# Patient Record
Sex: Male | Born: 1940 | Race: Black or African American | Hispanic: No | Marital: Married | Smoking: Former smoker
Health system: Southern US, Community
[De-identification: ages and names within clinical notes are randomized; demographics above are authoritative.]

## PROBLEM LIST (undated history)

## (undated) DIAGNOSIS — I10 Essential (primary) hypertension: Secondary | ICD-10-CM

## (undated) DIAGNOSIS — R001 Bradycardia, unspecified: Secondary | ICD-10-CM

## (undated) DIAGNOSIS — I639 Cerebral infarction, unspecified: Secondary | ICD-10-CM

## (undated) HISTORY — PX: NO PAST SURGERIES: SHX2092

---

## 2005-08-05 HISTORY — PX: CAROTID STENT: SHX1301

## 2013-05-31 ENCOUNTER — Emergency Department: Payer: Self-pay | Admitting: Emergency Medicine
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
## 2021-05-18 ENCOUNTER — Observation Stay (HOSPITAL_COMMUNITY)
Admission: EM | Admit: 2021-05-18 | Discharge: 2021-05-19 | Disposition: A | Discharge status: Home / Self Care | Payer: No Typology Code available for payment source | Attending: Emergency Medicine | Admitting: Emergency Medicine

## 2021-05-18 ENCOUNTER — Emergency Department (HOSPITAL_COMMUNITY): Payer: No Typology Code available for payment source

## 2021-05-18 ENCOUNTER — Encounter (HOSPITAL_COMMUNITY): Payer: Self-pay | Admitting: Emergency Medicine

## 2021-05-18 ENCOUNTER — Observation Stay (HOSPITAL_COMMUNITY): Payer: No Typology Code available for payment source

## 2021-05-18 DIAGNOSIS — Z7982 Long term (current) use of aspirin: Secondary | ICD-10-CM | POA: Insufficient documentation

## 2021-05-18 DIAGNOSIS — Y900 Blood alcohol level of less than 20 mg/100 ml: Secondary | ICD-10-CM | POA: Diagnosis not present

## 2021-05-18 DIAGNOSIS — I129 Hypertensive chronic kidney disease with stage 1 through stage 4 chronic kidney disease, or unspecified chronic kidney disease: Secondary | ICD-10-CM | POA: Diagnosis not present

## 2021-05-18 DIAGNOSIS — Z20822 Contact with and (suspected) exposure to covid-19: Secondary | ICD-10-CM | POA: Insufficient documentation

## 2021-05-18 DIAGNOSIS — N189 Chronic kidney disease, unspecified: Secondary | ICD-10-CM | POA: Diagnosis not present

## 2021-05-18 DIAGNOSIS — R531 Weakness: Secondary | ICD-10-CM

## 2021-05-18 DIAGNOSIS — R7303 Prediabetes: Secondary | ICD-10-CM | POA: Diagnosis not present

## 2021-05-18 DIAGNOSIS — I639 Cerebral infarction, unspecified: Secondary | ICD-10-CM | POA: Diagnosis not present

## 2021-05-18 DIAGNOSIS — Z8673 Personal history of transient ischemic attack (TIA), and cerebral infarction without residual deficits: Secondary | ICD-10-CM | POA: Insufficient documentation

## 2021-05-18 DIAGNOSIS — R42 Dizziness and giddiness: Secondary | ICD-10-CM | POA: Diagnosis present

## 2021-05-18 DIAGNOSIS — Z79899 Other long term (current) drug therapy: Secondary | ICD-10-CM | POA: Insufficient documentation

## 2021-05-18 DIAGNOSIS — R001 Bradycardia, unspecified: Secondary | ICD-10-CM

## 2021-05-18 DIAGNOSIS — I2510 Atherosclerotic heart disease of native coronary artery without angina pectoris: Secondary | ICD-10-CM | POA: Insufficient documentation

## 2021-05-18 DIAGNOSIS — R2681 Unsteadiness on feet: Secondary | ICD-10-CM

## 2021-05-18 DIAGNOSIS — I6330 Cerebral infarction due to thrombosis of unspecified cerebral artery: Secondary | ICD-10-CM

## 2021-05-18 DIAGNOSIS — R2689 Other abnormalities of gait and mobility: Secondary | ICD-10-CM

## 2021-05-18 HISTORY — DX: Cerebral infarction, unspecified: I63.9

## 2021-05-18 HISTORY — DX: Bradycardia, unspecified: R00.1

## 2021-05-18 HISTORY — DX: Essential (primary) hypertension: I10

## 2021-05-18 LAB — COMPREHENSIVE METABOLIC PANEL
ALT: 15 U/L (ref 0–44)
AST: 29 U/L (ref 15–41)
Albumin: 3.7 g/dL (ref 3.5–5.0)
Alkaline Phosphatase: 63 U/L (ref 38–126)
Anion gap: 9 (ref 5–15)
BUN: 11 mg/dL (ref 8–23)
CO2: 24 mmol/L (ref 22–32)
Calcium: 9.6 mg/dL (ref 8.9–10.3)
Chloride: 105 mmol/L (ref 98–111)
Creatinine, Ser: 1.34 mg/dL — ABNORMAL HIGH (ref 0.61–1.24)
GFR, Estimated: 55 mL/min — ABNORMAL LOW (ref >60)
Glucose, Bld: 151 mg/dL — ABNORMAL HIGH (ref 70–99)
Potassium: 3.6 mmol/L (ref 3.5–5.1)
Sodium: 138 mmol/L (ref 135–145)
Total Bilirubin: 0.7 mg/dL (ref 0.3–1.2)
Total Protein: 6.9 g/dL (ref 6.5–8.1)

## 2021-05-18 LAB — URINALYSIS, ROUTINE W REFLEX MICROSCOPIC
Bilirubin Urine: NEGATIVE
Glucose, UA: NEGATIVE mg/dL
Hgb urine dipstick: NEGATIVE
Ketones, ur: NEGATIVE mg/dL
Leukocytes,Ua: NEGATIVE
Nitrite: NEGATIVE
Protein, ur: NEGATIVE mg/dL
Specific Gravity, Urine: 1.019 (ref 1.005–1.030)
pH: 6 (ref 5.0–8.0)

## 2021-05-18 LAB — RAPID URINE DRUG SCREEN, HOSP PERFORMED
Amphetamines: NOT DETECTED
Barbiturates: NOT DETECTED
Benzodiazepines: NOT DETECTED
Cocaine: NOT DETECTED
Opiates: NOT DETECTED
Tetrahydrocannabinol: NOT DETECTED

## 2021-05-18 LAB — I-STAT CHEM 8, ED
BUN: 12 mg/dL (ref 8–23)
Calcium, Ion: 1.2 mmol/L (ref 1.15–1.40)
Chloride: 103 mmol/L (ref 98–111)
Creatinine, Ser: 1.2 mg/dL (ref 0.61–1.24)
Glucose, Bld: 153 mg/dL — ABNORMAL HIGH (ref 70–99)
HCT: 39 % (ref 39.0–52.0)
Hemoglobin: 13.3 g/dL (ref 13.0–17.0)
Potassium: 3.7 mmol/L (ref 3.5–5.1)
Sodium: 140 mmol/L (ref 135–145)
TCO2: 26 mmol/L (ref 22–32)

## 2021-05-18 LAB — TROPONIN I (HIGH SENSITIVITY): Troponin I (High Sensitivity): 8 ng/L (ref <18)

## 2021-05-18 LAB — CBC
HCT: 39.7 % (ref 39.0–52.0)
Hemoglobin: 12.8 g/dL — ABNORMAL LOW (ref 13.0–17.0)
MCH: 30.1 pg (ref 26.0–34.0)
MCHC: 32.2 g/dL (ref 30.0–36.0)
MCV: 93.4 fL (ref 80.0–100.0)
Platelets: 210 10*3/uL (ref 150–400)
RBC: 4.25 MIL/uL (ref 4.22–5.81)
RDW: 13.6 % (ref 11.5–15.5)
WBC: 5.5 10*3/uL (ref 4.0–10.5)
nRBC: 0 % (ref 0.0–0.2)

## 2021-05-18 LAB — LIPID PANEL
Cholesterol: 169 mg/dL (ref 0–200)
HDL: 45 mg/dL (ref >40)
LDL Cholesterol: 109 mg/dL — ABNORMAL HIGH (ref 0–99)
Total CHOL/HDL Ratio: 3.8 RATIO
Triglycerides: 76 mg/dL (ref <150)
VLDL: 15 mg/dL (ref 0–40)

## 2021-05-18 LAB — ETHANOL: Alcohol, Ethyl (B): <10 mg/dL (ref <10)

## 2021-05-18 LAB — HEMOGLOBIN A1C
Hgb A1c MFr Bld: 5.7 % — ABNORMAL HIGH (ref 4.8–5.6)
Mean Plasma Glucose: 116.89 mg/dL

## 2021-05-18 LAB — DIFFERENTIAL
Abs Immature Granulocytes: 0.02 10*3/uL (ref 0.00–0.07)
Basophils Absolute: 0 10*3/uL (ref 0.0–0.1)
Basophils Relative: 0 %
Eosinophils Absolute: 0.2 10*3/uL (ref 0.0–0.5)
Eosinophils Relative: 4 %
Immature Granulocytes: 0 %
Lymphocytes Relative: 29 %
Lymphs Abs: 1.6 10*3/uL (ref 0.7–4.0)
Monocytes Absolute: 0.3 10*3/uL (ref 0.1–1.0)
Monocytes Relative: 6 %
Neutro Abs: 3.3 10*3/uL (ref 1.7–7.7)
Neutrophils Relative %: 61 %

## 2021-05-18 LAB — PROTIME-INR
INR: 1 (ref 0.8–1.2)
Prothrombin Time: 13.5 seconds (ref 11.4–15.2)

## 2021-05-18 LAB — RESP PANEL BY RT-PCR (FLU A&B, COVID) ARPGX2
Influenza A by PCR: NEGATIVE
Influenza B by PCR: NEGATIVE
SARS Coronavirus 2 by RT PCR: NEGATIVE

## 2021-05-18 LAB — APTT: aPTT: 35 seconds (ref 24–36)

## 2021-05-18 MED ORDER — ACETAMINOPHEN 650 MG RE SUPP: 650.0000 mg | Freq: Four times a day (QID) | RECTAL | Status: DC | PRN

## 2021-05-18 MED ORDER — CLOPIDOGREL BISULFATE 75 MG PO TABS
75.0000 mg | ORAL_TABLET | Freq: Every day | ORAL | Status: DC
  Administered 2021-05-19: 75 mg via ORAL
  Filled 2021-05-18: qty 1

## 2021-05-18 MED ORDER — ASPIRIN 325 MG PO TABS
325.0000 mg | ORAL_TABLET | Freq: Once | ORAL | Status: DC
  Filled 2021-05-18: qty 1

## 2021-05-18 MED ORDER — POLYETHYLENE GLYCOL 3350 17 G PO PACK: 17.0000 g | PACK | Freq: Every day | ORAL | Status: DC | PRN

## 2021-05-18 MED ORDER — CLOPIDOGREL BISULFATE 75 MG PO TABS
75.0000 mg | ORAL_TABLET | Freq: Once | ORAL | Status: AC
  Administered 2021-05-18: 75 mg via ORAL
  Filled 2021-05-18: qty 1

## 2021-05-18 MED ORDER — ATORVASTATIN CALCIUM 80 MG PO TABS
80.0000 mg | ORAL_TABLET | Freq: Every day | ORAL | Status: DC
  Administered 2021-05-19: 80 mg via ORAL
  Filled 2021-05-18: qty 1

## 2021-05-18 MED ORDER — ACETAMINOPHEN 325 MG PO TABS: 650.0000 mg | ORAL_TABLET | Freq: Four times a day (QID) | ORAL | Status: DC | PRN

## 2021-05-18 MED ORDER — ASPIRIN EC 81 MG PO TBEC
81.0000 mg | DELAYED_RELEASE_TABLET | Freq: Every day | ORAL | Status: DC
  Administered 2021-05-19: 81 mg via ORAL
  Filled 2021-05-18: qty 1

## 2021-05-18 MED ORDER — ENOXAPARIN SODIUM 40 MG/0.4ML IJ SOSY
40.0000 mg | PREFILLED_SYRINGE | INTRAMUSCULAR | Status: DC
  Administered 2021-05-18 – 2021-05-19 (×2): 40 mg via SUBCUTANEOUS
  Filled 2021-05-18 (×2): qty 0.4

## 2021-05-18 NOTE — H&P (Signed)
Date: 05/18/2021               Patient Name:  Ryan Valdez MRN: [id number]  DOB: 07/27/1941 Age / Sex: 80 y.o., male   PCP: [facility]         Medical Service: Internal Medicine Teaching Service         Attending Physician: Dr. Reymundo Poll, MD    First Contact: Dr. Welton Flakes Pager: [phone]  Second Contact: Dr. Huel Cote Pager: [phone]       After Hours (After 5p/  First Contact Pager: [phone]  weekends / holidays): Second Contact Pager: [phone]   Chief Complaint: Unsteadiness  History of Present Illness:   [name] is a 80 y/o male with a PMHx of CVA, CAD s/p PCI, HLD, HTN, OSA who presents to the ED with c/o unsteadiness.   [name] states that he was driving to [place], [place] today to visit his daughter and help her move into a new home.  On the drive here, he noticed that he was having difficulty with depth perception in terms of knowing when to break.  Once he arrived at his daughter's home, he noticed a feeling of unsteadiness.  He recalls chronic lower left weakness from prior stroke but no other focal neurological deficit.  He denies any trauma, loss of consciousness, dizziness, headache, vision changes, chest pain, shortness of breath.  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  He cannot recall the stroke is similar to prior presentation.  Otherwise, [name] endorses some congestion and mild cough for the last 3 or 4 days.  He has not been around anyone else was sick.  [name] endorses a past medical history of bradycardia with usual heart rate between 50s and 60s.  He has only occasionally seen his heart rate in the 40s.  He has always been asymptomatic.  Meds:  Aspirin 81 mg daily Amlodipine 10 mg daily Atorvastatin 80 mg daily Lisinopril 40 mg daily Tamsulosin 0.4 mg daily   Allergies: Allergies as of 05/18/2021 - Review Complete 05/18/2021  Allergen Reaction Noted   Iodine Hives  04/04/2008   Iodinated diagnostic agents Hives 01/20/2019   Shellfish-derived products Hives 02/21/2020   Past Medical History:  Diagnosis Date   Stroke [facility])    Family History:  CVA-father Hypertension-sister  Social History:  Lives in [place], [place] alone. Independent in ADLs. Support system includes sister who lives near by.  Former tobacco user. Quit approximately 24 years ago.  Denies alcohol or drug use  [place] veteran   Review of Systems: A complete ROS was negative except as per HPI.   Physical Exam: Blood pressure (!) 175/94, pulse (!) 48, temperature 97.7 F (36.5 C), temperature source Oral, resp. rate 19, height 5\' 8"  (1.727 m), weight 86.2 kg, SpO2 98 %.  Physical Exam Vitals and nursing note reviewed.  Constitutional:      General: He is not in acute distress.    Appearance: He is normal weight.  HENT:     Head: Normocephalic and atraumatic.     Mouth/Throat:     Mouth: Mucous membranes are moist.     Pharynx: Oropharynx is clear.  Eyes:     General: No visual field deficit.    Extraocular Movements: Extraocular movements intact.     Conjunctiva/sclera: Conjunctivae normal.     Pupils: Pupils are equal, round, and reactive to light.  Cardiovascular:  Rate and Rhythm: Regular rhythm. Bradycardia present.     Heart sounds: No murmur heard.    Comments: Occasional PVCs noted on telemetry Pulmonary:     Effort: Pulmonary effort is normal. No respiratory distress.     Breath sounds: Normal breath sounds. No wheezing or rales.  Abdominal:     General: Bowel sounds are normal. There is no distension.     Palpations: Abdomen is soft.     Tenderness: There is no abdominal tenderness. There is no guarding.  Musculoskeletal:     Right lower leg: No edema.     Left lower leg: No edema.  Skin:    General: Skin is warm and dry.  Neurological:     Mental Status: He is alert and oriented to person, place, and time.     Cranial Nerves: Cranial nerves are  intact. No dysarthria or facial asymmetry.     Sensory: Sensation is intact. No sensory deficit.     Motor: Motor function is intact.     Comments: 5/5 strength in all upper and lower extremities. No pronator drift  Psychiatric:        Mood and Affect: Mood normal.        Behavior: Behavior normal.        Thought Content: Thought content normal.        Judgment: Judgment normal.   EKG: personally reviewed my interpretation is: Sinus bradycardia with first-degree AV block; J-point elevation in V2 and 3.  No other ST or T wave abnormalities.  Assessment & Plan by Problem: Active Problems:   Gait instability  Ryan Valdez is a 80 year old gentleman with a past medical history of CVA, CAD s/p PCI, hypertension, hyperlipidemia, OSA who is presenting for unsteadiness and currently admitted for acute CVA evaluation.  # Gait Instability  # Hx of  Patient presenting with several hour history of unsteadiness on his feet and subjective depth perception difficulties.  Given history of prior CVA with multiple risk factor (including prior stroke, hypertension, hyperlipidemia, and prior carotid artery stenosis), work-up for acute CVA initiated.  CT head is negative with MRI pending.  Differential also includes symptomatic bradycardia, however this would be an unusual presentation.  On the other hand, patient has a history of antalgic gait dating back to 2019 for which he was evaluated by [facility] neurology; work-up seems to raise suspicion that it may be secondary to spinal stenosis.  - Neurology following; appreciate their recommendations - MRI brain pending - If evidence of acute infarct, will need MRA head and neck - Aspirin 324 mg once.  Start aspirin 81 mg daily tomorrow - Plavix 75 mg - Resume home atorvastatin 80 mg daily - A1c, lipid panel pending  # Sinus Bradycardia  Patient presenting with sinus bradycardia with first-degree AV block with rates predominantly in the 40s to 50s.  Patient denies  any dizziness or loss of consciousness.  Per chart review, bradycardia noted back in 2020.  - TTE ordered to evaluate for structural abnormality - TSH pending - Telemetry  # Hypertension  - Hold home antihypertensives for permissive hypertension  # Hx of PFO During evaluation for prior CVA, patient was noted to have a PFO with right to left interatrial shunt.  He followed up with the [facility].  Patient did not pursue PFO closure.  I have low suspicion that this is contributing to current gait instability.  # CKD  Per review of limited previous labs, creatinine seems  at baseline with prior creatinine of 1.4 in June 2022.  Diet: Heart Healthy VTE: Enoxaparin IVF: None,None Code: Full  Prior to Admission Living Arrangement: Home, living alone Anticipated Discharge Location: Home Barriers to Discharge: Medical evaluation, including MRI and TTE  Dispo: Admit patient to Observation with expected length of stay less than 2 midnights.  Signed: Dr. Verdene Lennert Internal Medicine PGY-3  Pager: [phone] After 5pm on weekdays and 1pm on weekends: On Call pager [phone]  05/18/2021, 7:58 PM
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
## 2021-05-18 NOTE — ED Notes (Signed)
Sent a follow up message to PA to inform about pt low HR and aspirin dose order.

## 2021-05-18 NOTE — ED Notes (Signed)
Pt stated he took one 81 mg aspirin today. Notified EDP to inquire about the aspirin dose the pt can take.

## 2021-05-18 NOTE — Progress Notes (Signed)
Stroke Response Nurse Documentation Code Documentation  Ryan Valdez is a 80 y.o. male arriving to [facility] ED via Private Vehicle on 05-18-2021 with past medical hx of CVA, carotid stent. On No antithrombotic. Code stroke was activated by ED.   Patient from home where he was LKW at 1120 and now complaining of being off balance.  He left [place] about 1120 and felt his normal self.  While he was driving he felt that his reaction time was slow especially with his left leg.  After he arrived in [place] he and family states that he was "off balance".  He states he has had a cold this week and took some cold medicine today, otherwise he has had no complaints.   Stroke team at the bedside on patient arrival. Labs drawn and patient cleared for CT by Fayrene Helper PA. Patient to CT with team. NIHSS 0, see documentation for details and code stroke times. Patient with no focal deficits on exam.  He is a little off balance when he stands up. The following imaging was completed:  CT. Patient is not a candidate for IV Thrombolytic due to mild symptom. Patient is not a candidate for IR due to no LVO suspected.   Care/Plan: VS and neuro checks q 2 hours.   Bedside handoff with ED RN Carollee Herter.    Marcellina Millin  Stroke Response RN

## 2021-05-18 NOTE — ED Notes (Signed)
Called [facility] to activate code stroke, spoke with Michele Mcalpine

## 2021-05-18 NOTE — ED Notes (Signed)
PA stated to give the original 324 aspirin dose and the admitting doctor will be notified about pt bradycardia.

## 2021-05-18 NOTE — ED Notes (Signed)
RN Aware of pt HR  

## 2021-05-18 NOTE — ED Notes (Signed)
Noticed pt has low HR, unsure if it is his normal. Notified EDP

## 2021-05-18 NOTE — ED Triage Notes (Signed)
Pt arrives with family who reports he has been off balance for about an hour. Pt states that when he was driving today, he felt like his reaction time was delayed. ED PA at bedside to evaluate.

## 2021-05-18 NOTE — ED Notes (Signed)
PA states to activate code stroke
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
## 2021-05-19 ENCOUNTER — Encounter (HOSPITAL_COMMUNITY): Payer: Self-pay | Admitting: Internal Medicine

## 2021-05-19 ENCOUNTER — Other Ambulatory Visit (HOSPITAL_COMMUNITY): Payer: Non-veteran care

## 2021-05-19 ENCOUNTER — Observation Stay (HOSPITAL_BASED_OUTPATIENT_CLINIC_OR_DEPARTMENT_OTHER): Payer: No Typology Code available for payment source

## 2021-05-19 ENCOUNTER — Observation Stay (HOSPITAL_COMMUNITY): Payer: No Typology Code available for payment source

## 2021-05-19 DIAGNOSIS — I6389 Other cerebral infarction: Secondary | ICD-10-CM | POA: Diagnosis not present

## 2021-05-19 DIAGNOSIS — R001 Bradycardia, unspecified: Secondary | ICD-10-CM | POA: Diagnosis not present

## 2021-05-19 DIAGNOSIS — I639 Cerebral infarction, unspecified: Secondary | ICD-10-CM

## 2021-05-19 DIAGNOSIS — I6330 Cerebral infarction due to thrombosis of unspecified cerebral artery: Secondary | ICD-10-CM | POA: Diagnosis not present

## 2021-05-19 DIAGNOSIS — I6381 Other cerebral infarction due to occlusion or stenosis of small artery: Secondary | ICD-10-CM | POA: Diagnosis not present

## 2021-05-19 LAB — ECHOCARDIOGRAM COMPLETE
AR max vel: 2.1 cm2
AV Area VTI: 2.17 cm2
AV Area mean vel: 2.2 cm2
AV Mean grad: 6 mmHg
AV Peak grad: 13.1 mmHg
Ao pk vel: 1.81 m/s
Area-P 1/2: 2.9 cm2
Calc EF: 64.1 %
Height: 68 in
MV VTI: 2.42 cm2
S' Lateral: 2.5 cm
Single Plane A2C EF: 59.9 %
Single Plane A4C EF: 66.7 %
Weight: 3040 oz

## 2021-05-19 LAB — BASIC METABOLIC PANEL
Anion gap: 7 (ref 5–15)
BUN: 9 mg/dL (ref 8–23)
CO2: 27 mmol/L (ref 22–32)
Calcium: 9.4 mg/dL (ref 8.9–10.3)
Chloride: 103 mmol/L (ref 98–111)
Creatinine, Ser: 1.18 mg/dL (ref 0.61–1.24)
GFR, Estimated: >60 mL/min (ref >60)
Glucose, Bld: 82 mg/dL (ref 70–99)
Potassium: 3.5 mmol/L (ref 3.5–5.1)
Sodium: 137 mmol/L (ref 135–145)

## 2021-05-19 LAB — CBC
HCT: 38.1 % — ABNORMAL LOW (ref 39.0–52.0)
Hemoglobin: 12.4 g/dL — ABNORMAL LOW (ref 13.0–17.0)
MCH: 30 pg (ref 26.0–34.0)
MCHC: 32.5 g/dL (ref 30.0–36.0)
MCV: 92.3 fL (ref 80.0–100.0)
Platelets: 207 10*3/uL (ref 150–400)
RBC: 4.13 MIL/uL — ABNORMAL LOW (ref 4.22–5.81)
RDW: 13.5 % (ref 11.5–15.5)
WBC: 4.5 10*3/uL (ref 4.0–10.5)
nRBC: 0 % (ref 0.0–0.2)

## 2021-05-19 MED ORDER — STROKE: EARLY STAGES OF RECOVERY BOOK: Freq: Once | Status: DC

## 2021-05-19 MED ORDER — EZETIMIBE 10 MG PO TABS: 10.0000 mg | ORAL_TABLET | Freq: Every day | ORAL | Status: DC

## 2021-05-19 MED ORDER — ATORVASTATIN CALCIUM 40 MG PO TABS: 40.0000 mg | ORAL_TABLET | Freq: Every day | ORAL | Status: DC

## 2021-05-19 MED ORDER — LISINOPRIL 40 MG PO TABS: 20.0000 mg | ORAL_TABLET | Freq: Every day | ORAL | 0 refills | Status: AC

## 2021-05-19 MED ORDER — ASPIRIN 81 MG PO TBEC: 81.0000 mg | DELAYED_RELEASE_TABLET | Freq: Every day | ORAL | 0 refills | Status: AC

## 2021-05-19 MED ORDER — AMLODIPINE BESYLATE 10 MG PO TABS: 10.0000 mg | ORAL_TABLET | Freq: Every day | ORAL | 0 refills | Status: AC

## 2021-05-19 MED ORDER — CLOPIDOGREL BISULFATE 75 MG PO TABS: 75.0000 mg | ORAL_TABLET | Freq: Every day | ORAL | 0 refills | Status: AC

## 2021-05-19 NOTE — ED Notes (Signed)
Breakfast Order Placed ?

## 2021-05-19 NOTE — Evaluation (Signed)
Physical Therapy Evaluation and Discharge Patient Details Name: Ryan Valdez MRN: [id number] DOB: 1940-12-07 Today's Date: 05/19/2021  History of Present Illness  79 y/o male presented to the ED 05/18/21 with c/o unsteadiness. CT head negative. symptoms resolved with NIHSS=0; +sinus bradycardia with first degree AV block; MRI incidental punctate left caudate tail stroke   PMHx of cerebellar CVA, CAD s/p PCI, HLD, HTN, OSA  Clinical Impression   Patient evaluated by Physical Therapy with no further acute PT needs identified. All education has been completed and the patient has no further questions.  PT is signing off. Thank you for this referral.      Recommendations for follow up therapy are one component of a multi-disciplinary discharge planning process, led by the attending physician.  Recommendations may be updated based on patient status, additional functional criteria and insurance authorization.  Follow Up Recommendations No PT follow up    Equipment Recommendations  None recommended by PT    Recommendations for Other Services       Precautions / Restrictions Precautions Precautions: Fall Precaution Comments: low risk      Mobility  Bed Mobility Overal bed mobility: Independent                  Transfers Overall transfer level: Modified independent Equipment used: None                Ambulation/Gait Ambulation/Gait assistance: Modified independent (Device/Increase time) Gait Distance (Feet): 130 Feet Assistive device:  (pt's walking stick) Gait Pattern/deviations: Step-through pattern;Wide base of support;Antalgic   Gait velocity interpretation: 1.31 - 2.62 ft/sec, indicative of limited community ambulator General Gait Details: slight antalgic pattern  Stairs            Wheelchair Mobility    Modified Rankin (Stroke Patients Only) Modified Rankin (Stroke Patients Only) Pre-Morbid Rankin Score: Moderate disability Modified Rankin: Moderate  disability     Balance     Sitting balance-Leahy Scale: Normal Sitting balance - Comments: donned shoes sitting at EOB   Standing balance support: No upper extremity supported Standing balance-Leahy Scale: Good           Rhomberg - Eyes Opened: 30 Rhomberg - Eyes Closed: 10 (mild incr sway with independent recovery)                 Pertinent Vitals/Pain Pain Assessment: Faces Faces Pain Scale: Hurts a little bit Pain Location: left hip Pain Descriptors / Indicators: Discomfort Pain Intervention(s): Monitored during session    Home Living Family/patient expects to be discharged to:: Private residence Living Arrangements: Alone Available Help at Discharge: Family Type of Home: Mobile home Home Access: Stairs to enter Entrance Stairs-Rails: Right Entrance Stairs-Number of Steps: 5 Home Layout: One level Home Equipment: Other (comment) (walking stick)      Prior Function Level of Independence: Independent with assistive device(s)         Comments: uses walking stick since August due to sciatica/left hip pain     Hand Dominance   Dominant Hand: Right    Extremity/Trunk Assessment   Upper Extremity Assessment Upper Extremity Assessment: Defer to OT evaluation    Lower Extremity Assessment Lower Extremity Assessment: LLE deficits/detail LLE Coordination: decreased gross motor (minimally decreased heel to shin)    Cervical / Trunk Assessment Cervical / Trunk Assessment: Normal  Communication   Communication: No difficulties  Cognition Arousal/Alertness: Awake/alert Behavior During Therapy: WFL for tasks assessed/performed Overall Cognitive Status: Within Functional Limits for tasks assessed  General Comments      Exercises     Assessment/Plan    PT Assessment Patent does not need any further PT services  PT Problem List         PT Treatment Interventions      PT Goals (Current  goals can be found in the Care Plan section)  Acute Rehab PT Goals PT Goal Formulation: All assessment and education complete, DC therapy    Frequency     Barriers to discharge        Co-evaluation               AM-PAC PT "6 Clicks" Mobility  Outcome Measure Help needed turning from your back to your side while in a flat bed without using bedrails?: None Help needed moving from lying on your back to sitting on the side of a flat bed without using bedrails?: None Help needed moving to and from a bed to a chair (including a wheelchair)?: None Help needed standing up from a chair using your arms (e.g., wheelchair or bedside chair)?: None Help needed to walk in hospital room?: None Help needed climbing 3-5 steps with a railing? : None 6 Click Score: 24    End of Session Equipment Utilized During Treatment: Gait belt Activity Tolerance: Patient tolerated treatment well Patient left: in bed;with call bell/phone within reach;Other (comment) (with echo technician) Nurse Communication: Mobility status;Other (comment) (no PT or DME needs) PT Visit Diagnosis: Unsteadiness on feet (R26.81)    Time: 7939-0300 PT Time Calculation (min) (ACUTE ONLY): 11 min   Charges:   PT Evaluation $PT Eval Low Complexity: 1 Low           Jerolyn Center, PT Acute Rehabilitation Services  Pager [phone] Office [phone]   Zena Amos 05/19/2021, 3:16 PM

## 2021-05-19 NOTE — ED Notes (Signed)
Patient transported to MRI 

## 2021-05-19 NOTE — ED Notes (Signed)
Pt resting on stretcher with eyes closed, respirations even and unlabored. No acute changes noted. Will continue to monitor. Lights off, side rails up x2, call bell within reach.  

## 2021-05-19 NOTE — ED Notes (Signed)
PT in progress. °

## 2021-05-19 NOTE — Progress Notes (Addendum)
STROKE TEAM PROGRESS NOTE   ATTENDING NOTE: I reviewed above note and agree with the assessment and plan. Pt was seen and examined.   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  CT no acute abnormality, but chronic small left cerebellar infarct.  MRI showed punctate left caudate tail acute infarct.  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  Carotid Doppler unremarkable.  LE venous Doppler no DVT.  EF 60 to 65%.  LDL 109, A1c 5.7, UDS negative.  On exam, patient awake alert, sitting at edge of bed, no complaints.  Orientated x3, no aphasia, no dysarthria, follows simple commands, able to name repeat.  Visual fields full, no gaze palsy, facial symmetrical.  Bilateral upper extremity 5/5, left LE proximal 4/5 due to sciatica pain and chronic knee pain, distal 5/5.  Sensation symmetrical, finger-to-nose intact.  Etiology for patient stroke likely small vessel disease.  Recommend continue aspirin 81 and Plavix 75 DAPT for 3 weeks and then Plavix alone.  Continue statin.  Stroke risk factor modification.  PT no recommendation.  For detailed assessment and plan, please refer to above as I have made changes wherever appropriate.   Neurology will sign off. Please call with questions. Pt will follow up with stroke clinic NP at [facility] in about 4 weeks. Thanks for the consult.   Marvel Plan, MD PhD Stroke Neurology 05/19/2021 7:12 PM    INTERVAL HISTORY No acute events since arrival. No visitors at bedside.   Driving here from his home in [place] when he noticed depth perception was impaired and he was too close to other cars before he realized it. No tunnel vision, spots, floaters, dizziness or lightheadedness, chest pain, no palpitations, no feeling he would pass out, no nausea or  vomiting. He did not stop driving. Never had this kind of episode before. Vision changes resolved after he got to his daughter's house. Then after arriving there he felt for a few minutes like he was having the same stroke he had previously with onset of suddenly impaired balance. At that point he presented to ED. He thinks the feeling lasted for about an hour.   Today he feels completely back to normal, has no complaints. Would like to go home. He endorses good medication compliance. He gets all his care through the [place].  We discussed his stroke diagnosis, ongoing work and plan of care.His questions were answered.   Vitals:   05/19/21 0345 05/19/21 0445 05/19/21 0600 05/19/21 0630  BP: (!) 158/77 (!) 145/70 (!) 149/72 (!) 147/77  Pulse: (!) 47 (!) 50 (!) 46 (!) 45  Resp: 16 16 16 16   Temp:   98 F (36.7 C)   TempSrc:   Oral   SpO2: 99% 96% 99% 99%  Weight:      Height:       CBC:  Recent Labs  Lab 05/18/21 1515 05/19/21 0408  WBC 5.5 4.5  NEUTROABS 3.3  --   HGB 12.8* 12.4*  HCT 39.7 38.1*  MCV 93.4 92.3  PLT 210 207   Basic Metabolic Panel:  Recent Labs  Lab 05/18/21 1515 05/19/21 0408  NA 138 137  K 3.6 3.5  CL 105 103  CO2 24 27  GLUCOSE 151* 82  BUN 11 9  CREATININE 1.34* 1.18  CALCIUM 9.6 9.4   Lipid  Panel:  Recent Labs  Lab 05/18/21 1840  CHOL 169  TRIG 76  HDL 45  CHOLHDL 3.8  VLDL 15  LDLCALC 562*   HgbA1c:  Recent Labs  Lab 05/18/21 2000  HGBA1C 5.7*   Urine Drug Screen:  Recent Labs  Lab 05/18/21 1657  LABOPIA NONE DETECTED  COCAINSCRNUR NONE DETECTED  LABBENZ NONE DETECTED  AMPHETMU NONE DETECTED  THCU NONE DETECTED  LABBARB NONE DETECTED    Alcohol Level  Recent Labs  Lab 05/18/21 1515  ETH <10    IMAGING past 24 hours MR BRAIN WO CONTRAST  Result Date: 05/18/2021 CLINICAL DATA:  Neuro deficit, acute, stroke suspected EXAM: MRI HEAD WITHOUT CONTRAST TECHNIQUE: Multiplanar, multiecho pulse sequences of the brain and  surrounding structures were obtained without intravenous contrast. COMPARISON:  None. FINDINGS: Brain: There is a punctate focus of abnormal diffusion restriction at the tail of the left caudate. No acute or chronic hemorrhage. Hyperintense T2-weighted signal is moderately widespread throughout the white matter. Generalized volume loss without a clear lobar predilection. There are multiple old cerebellar infarcts. The midline structures are normal. Vascular: Major flow voids are preserved. Skull and upper cervical spine: Normal calvarium and skull base. Visualized upper cervical spine and soft tissues are normal. Sinuses/Orbits:No paranasal sinus fluid levels or advanced mucosal thickening. No mastoid or middle ear effusion. Normal orbits. IMPRESSION: 1. Punctate focus of acute ischemia at the left caudate tail. No hemorrhage or mass effect. 2. 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 Electronically Signed   By: Deatra Robinson M.D.   On: 05/18/2021 21:07   CT HEAD CODE STROKE WO CONTRAST  Result Date: 05/18/2021 CLINICAL DATA:  Code stroke.  Neuro deficit, acute, stroke suspected EXAM: CT HEAD WITHOUT CONTRAST TECHNIQUE: Contiguous axial images were obtained from the base of the skull through the vertex without intravenous contrast. COMPARISON:  2014 FINDINGS: Brain: There is no acute intracranial hemorrhage, mass effect, or edema. Patchy and confluent areas of hypoattenuation involving in the supratentorial white matter and central gray nuclei are nonspecific but probably reflect chronic microvascular ischemic changes. There are likely chronic infarcts of the cerebellum and right centrum semiovale. Prominence of the ventricles and sulci reflects mild parenchymal volume loss. No extra-axial collection. Vascular: No hyperdense vessel. There is intracranial atherosclerotic calcification at the skull base. Skull: Unremarkable. Sinuses/Orbits: Moderate paranasal sinus mucosal  thickening primarily involving ethmoids and maxillary sinuses. Orbits are unremarkable. Other: Mastoid air cells are clear. ASPECTS ([place] Stroke Program Early CT Score) - Ganglionic level infarction (caudate, lentiform nuclei, internal capsule, insula, M1-M3 cortex): 7 - Supraganglionic infarction (M4-M6 cortex): 3 Total score (0-10 with 10 being normal): 10 IMPRESSION: There is no acute intracranial hemorrhage or evidence of acute infarction. ASPECT score is 10. Chronic microvascular ischemic changes and likely chronic small vessel infarcts. Presence of a superimposed acute small vessel infarct is difficult to exclude. Initial results were communicated to Dr. Thomasena Edis at 3:13 pm on 05/18/2021 by text page via the [facility] messaging system. Electronically Signed   By: Guadlupe Spanish M.D.   On: 05/18/2021 15:16    PHYSICAL EXAM  Temp:  [97.7 F (36.5 C)-98 F (36.7 C)] 98 F (36.7 C) (10/15 0600) Pulse Rate:  [29-88] 44 (10/15 1031) Resp:  [11-19] 15 (10/15 1031) BP: (119-175)/(53-94) 147/78 (10/15 1031) SpO2:  [92 %-100 %] 99 % (10/15 1031) Weight:  [86.2 kg] 86.2 kg (10/14 1448)  General - Well nourished, well developed, in no apparent distress.  Ophthalmologic -  fundi not visualized due to noncooperation.  Cardiovascular - Regular rhythm and rate.  Mental Status -  Level of arousal and orientation to time, place, and person were intact. Language including expression, naming, repetition, comprehension was assessed and found intact. Attention span and concentration were normal. Recent and remote memory were intact. Fund of Knowledge was assessed and was intact.  Cranial Nerves II - XII - II - Visual field intact OU. III, IV, VI - Extraocular movements intact. V - Facial sensation intact bilaterally. VII - Facial movement intact bilaterally. VIII - Hearing & vestibular intact bilaterally. X - Palate elevates symmetrically. XI - Chin turning & shoulder shrug intact bilaterally. XII -  Tongue protrusion intact.  Motor Strength - The patient's strength was normal on the right in both UE and LE and mildly weak 4/5 on the left in UE and LE. Bulk was normal and fasciculations were absent.   Motor Tone - Muscle tone was assessed at the neck and appendages and was normal  Sensory - Light touch, temperature/pinprick were assessed and were symmetrical.    Coordination - RAMs impaired L hand, arm rolling intact bilaterally. Mildly ataxic LLE on heel to shin. No difficulty with Right heel to shin.   Gait and Station - deferred.   ASSESSMENT/PLAN Ryan Valdez is a 80 y.o. male with PMH significant for prior Stroke with residual mild LLE incoordination, HTN, HLD, CAD with stenting who presents with feeling distorted depth perception with some trouble focusing. He did take cough and cold meds a couple hours before this. His neurologic examination is notable for mild LUE and LLE incoordination which per patient is long standing after his cerebellar stroke.  **Patient has radiologic dye allergy with reaction of hives which limits imaging options.   Transient vision changes lasting about 30 minutes followed shortly by suddenly impaired balance/generalized weak feeling lasting about an hour with unclear etiology. Likely incidental finding of left caudate punctate stroke which may due to SVD.   Code Stroke CT head  There is no acute intracranial hemorrhage or evidence of acute infarction. ASPECT score is 10. Chronic microvascular ischemic changes and likely chronic small vessel infarcts. Presence of a superimposed acute small vessel infarct is difficult to exclude. MRI   1. Punctate focus of acute ischemia at the left caudate tail. No hemorrhage or mass effect. 2. 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   MRA Brain 1. Focal moderate to severe stenosis of the proximal right superior M2 division and more mild stenosis of the proximal left superior  M2division. 2. Otherwise, mild multifocal irregularity of the intracranial vasculature likely reflecting atherosclerotic disease, without other high-grade stenosis or occlusion.  Carotid Doppler  Right Carotid: Velocities in the right ICA are consistent with a 1-39%  stenosis.  Left Carotid: Velocities in the left ICA are consistent with a 1-39%  stenosis.  Vertebrals: Bilateral vertebral arteries demonstrate antegrade flow.  2D Echo   1. Left ventricular ejection fraction, by estimation, is 60 to 65%. The  left ventricle has normal function. The left ventricle has no regional  wall motion abnormalities. There is mild left ventricular hypertrophy.  Left ventricular diastolic parameters  were normal.   2. Right ventricular systolic function is normal. The right ventricular  size is normal. There is normal pulmonary artery systolic pressure.   3. Left atrial size was moderately dilated.   4. Cannot r/o PFO consider f/u bubble study.   5. The mitral valve is abnormal. Trivial mitral  valve regurgitation. No  evidence of mitral stenosis. Moderate mitral annular calcification.   6. The aortic valve is tricuspid. There is moderate calcification of the  aortic valve. Aortic valve regurgitation is mild. Sclerosis with no  stenosis.   7. The inferior vena cava is normal in size with greater than 50%  respiratory variability, suggesting right atrial pressure of 3 mmHg.  Bilat LE dopplers: PENDING  LDL 109 HgbA1c 5.7 VTE prophylaxis - recommended, management per primary team     Diet   Diet Heart Room service appropriate? Yes; Fluid consistency: Thin   On 81mg  ASA prior to admission, reports compliance Recommend DAPT x 3 weeks with 81mg  ASA and Plavix 75mg  followed by plavix monotherapy  Therapy recommendations:  Pending  Disposition:  Home  Close PCP follow up for monitoring and further work up unexplained episode of vision changes and impaired balance. As patient prefers to keep his  [facility] at the , we discussed the need [facility] to arrange stroke follow up in the neurology system within 4 weeks.   Hypertension Stable Permissive hypertension (OK if < 220/120) but gradually normalize in 5-7 days Long-term BP goal normotensive  Hyperlipidemia Home meds:  Lipitor 80mg   LDL 109, goal < 70 High intensity statin" Lipitor 80mg   Continue statin at discharge        Hx of PFO "Had a cerebellar stroke back in 01/2019 and during his workup he was noted to have a PFO." Referred to [facility]. Hypercoag labs were negative.    Other Stroke Risk Factors Advanced Age >/= 72  Former Cigarette smoker Current ETOH use, alcohol level <10, advised to drink no more than 2 drink(s) a day Overweight, Body mass index is 28.89 kg/m., BMI >/= 30 associated with increased stroke risk, recommend weight loss, diet and exercise as appropriate  Hx stroke/TIA Coronary artery disease with stenting   Other Active Problems   Hospital day # 0  Delila A Bailey-Modzik, NP-C   To contact Stroke Continuity provider, please refer to [place]. After hours, contact General Neurology

## 2021-05-19 NOTE — ED Notes (Addendum)
Neuro MD at bedside

## 2021-05-19 NOTE — Discharge Summary (Signed)
Name: Ryan Valdez MRN: [id number] DOB: 1940/08/16 80 y.o. PCP: [facility]  Date of Admission: 05/18/2021  2:39 PM Date of Discharge: 05/19/2021 Attending Physician: Reymundo Poll, MD  Discharge Diagnosis: 1.  Left caudate punctate stroke 2.  Episode of gait instability and difficulty with depth perception 3.  History of patent foramen ovale 4.  Sinus bradycardia 5.  Hypertension 6.  Prediabetes 7.  Chronic kidney disease  Discharge Medications: Allergies as of 05/19/2021       Reactions   Iodine Hives   Iodinated Diagnostic Agents Hives   Shellfish-derived Products Hives        Medication List     TAKE these medications    acetaminophen 500 MG tablet Commonly known as: TYLENOL Take 1,000 mg by mouth every 6 (six) hours as needed for mild pain.   amLODipine 10 MG tablet Commonly known as: NORVASC Take 1 tablet (10 mg total) by mouth daily. Start taking on: May 20, 2021   aspirin 81 MG EC tablet Take 1 tablet (81 mg total) by mouth daily for 21 days. Swallow whole. Start taking on: May 20, 2021   atorvastatin 80 MG tablet Commonly known as: LIPITOR Take 80 mg by mouth at bedtime.   benzonatate 100 MG capsule Commonly known as: TESSALON Take 100 mg by mouth 3 (three) times daily as needed for cough.   cetirizine 10 MG tablet Commonly known as: ZYRTEC Take 10 mg by mouth daily as needed for allergies.   clopidogrel 75 MG tablet Commonly known as: PLAVIX Take 1 tablet (75 mg total) by mouth daily. Start taking on: May 20, 2021   guaiFENesin 600 MG 12 hr tablet Commonly known as: MUCINEX Take 600 mg by mouth 2 (two) times daily as needed for cough.   lisinopril 40 MG tablet Commonly known as: ZESTRIL Take 0.5 tablets (20 mg total) by mouth daily. Start taking on: May 22, 2021 What changed: These instructions start on May 22, 2021. If you are unsure what to do until then, ask your doctor or other care provider.    tamsulosin 0.4 MG Caps capsule Commonly known as: FLOMAX Take 0.4 mg by mouth daily as needed (when travelling for urinary frequency).        Disposition and follow-up:   [name] was discharged from [facility] in Stable condition.  At the hospital follow up visit please address:  1.  Stroke-patient was placed on dual antiplatelet therapy for 3 weeks including Plavix 75 mg and aspirin 81 mg.  We will continue taking this until June 09, 2021 at which time he will discontinue his aspirin and continue taking Plavix indefinitely.    2.  Risk modifiers for stroke-patient was found to be prediabetic with A1c of 5.7.  He was educated on dietary changes that would help with decreasing blood sugars.  He was also advised to restart his amlodipine on October 16 and restart his lisinopril October 18.  His LDL was found to be greater than 100.  He reported none adherence with his atorvastatin due to forgetting to take it, no difficulties with myalgia.  Grades patient to take statin and follow-up with primary care physician to see if addition of Zetia is necessary for LDL goal less than 70.  3.  Patient has known history of patent foramen ovale.   2.  Labs / imaging needed at time of follow-up: Lipid panel, blood glucose  3.  Pending labs/ test needing follow-up: Lower extremity ultrasound  Follow-up Appointments:  Follow-up Information     Micki Riley, MD Follow up in 4 week(s).   Specialties: Neurology, Radiology Contact information: [address] [place] [address] [phone]         [facility]          [facility] Follow up in 1 week(s).                 Instructed [facility] weeks and to schedule a hospital follow-up appointment with his primary care provider.  Hospital Course by problem list: 1. Acute Left caudate punctate stroke Patient presented due to  left leg weakness and difficulty with depth perception.  He was driving from [place] to [place] to help his granddaughter move when he noticed that he was having trouble telling the distance between the car in front of him. Given his prior CVA with multiple risk factors (prior stroke, htn, hld, and prior carotid artery stenosis) patient was admitted for further workup.  MRI head showed that he had an acute CVA of left caudate tail.  Neurology consulted and on their evaluation, it was unclear if symptoms were secondary to stroke vs cold medication patient had been taking the last few days vs prior hx of gait instability with spinal stenosis.  Patient with known history of patent foramen ovale that was evaluated at [facility] congenital cardiology clinic, where he elected to not pursue closure of his PFO.  Lower extremity ultrasound showed  2.  Episode of gait instability and difficulty with depth perception Patient presented due to difficulty with depth perception while he was driving and gait instability.  He was found to have a stroke in the left caudate punctate region.  Unclear if his symptoms are due to his stroke.  He reports that he has been taking cold medications for the last 4 days and these medications can induce similar symptoms.  On chart review it is noted that he has previously been worked up for gait instability with a diagnosis of spinal stenosis.  3.  History of patent foramen ovale Patient with diagnosed patent foramen ovale. He was evaluated by the congenital cardiac clinic due to PFO and he elected not to proceed with closure.  He was evaluated via lower extremity ultrasound while hospitalized.  4.  Sinus bradycardia Patient has had asymptomatic bradycardia during hospitalization.  He states that he normally has slow heart rate.   5.  Hypertension His home medications include amlodipine 10 mg and lisinopril 40 mg.  Instructed patient to gradually add back medications to allow for  permissive hypertension following stroke.  Asked him to restart amlodipine on October 16 and restart lisinopril on October 18.  6.  Prediabetes A1c of 5.7.  No previous history of prediabetes.  Instructed patient to follow-up with his primary care physician advised him to decrease carbohydrate intake and sugar intake.  7.  Chronic kidney disease Per review of limited previous labs, creatinine seemed at baseline with prior creatinine of 1.4 in June 2022.   Discharge Exam:   BP (!) 148/78   Pulse (!) 48   Temp 97.8 F (36.6 C) (Oral)   Resp 16   Ht  (1.727 m)   Wt 86.2 kg   SpO2 100%   BMI 28.89 kg/m  Discharge exam:  General: Well-developed, well-nourished, elderly HENT: NCAT Eyes: no scleral icterus, conjunctiva clear CV: slow rate, regular rhythm, PVCs  noted on telemetry Pulm: CTAB, normal pulmonary effort GI: bowel sounds normal, no tenderness MSK: no edema in lower extremities bilaterally Skin: warm and dry Neuro: Cranial nerves are intact, no dysarthria or facial asymmetry, 5/5 muscle strength in upper and lower extremities, finger to nose test non-ataxic, sensation intact in upper and lower extremities, no pronator drift Psych: normal mood and affect  Pertinent Labs, Studies, and Procedures:  CBC Latest Ref Rng & Units 05/19/2021 05/18/2021 05/18/2021  WBC 4.0 - 10.5 K/uL 4.5 5.5 -  Hemoglobin 13.0 - 17.0 g/dL 12.4(L) 12.8(L) 13.3  Hematocrit 39.0 - 52.0 % 38.1(L) 39.7 39.0  Platelets 150 - 400 K/uL 207 210 -    BMP Latest Ref Rng & Units 05/19/2021 05/18/2021 05/18/2021  Glucose 70 - 99 mg/dL 82 527(P) 824(M)  BUN 8 - 23 mg/dL 9 11 12   Creatinine 0.61 - 1.24 mg/dL 3.53) 6.14(E  Sodium 135 - 145 mmol/L 137 138 140  Potassium 3.5 - 5.1 mmol/L 3.5 3.6 3.7  Chloride 98 - 111 mmol/L 103 105 103  CO2 22 - 32 mmol/L 27 24 -  Calcium 8.9 - 10.3 mg/dL 9.4 9.6 -   MR ANGIO HEAD WO CONTRAST  Result Date: 05/19/2021 CLINICAL DATA:  Neuro deficit, stroke suspected  EXAM: MRA HEAD WITHOUT CONTRAST TECHNIQUE: Angiographic images of the Circle of Willis were acquired using MRA technique without intravenous contrast. COMPARISON:  No direct comparison study available. Noncontrast CT head and MR head dated 1 day prior FINDINGS: Anterior circulation: The intracranial ICAs are patent with mild irregularity likely reflecting atherosclerotic disease. There is focal moderate to severe stenosis of the proximal right superior M2 division (1035-9). There is mild irregularity of the inferior right M2 division without high-grade stenosis. The distal right MCA branches are patent. There is mild focal stenosis at the origin of the left superior M2 division (1035-5). The left MCA is otherwise patent. The bilateral ACAs are patent. There is no aneurysm Posterior circulation: The V4 segments are patent with mild irregularity on the right likely reflecting atherosclerotic disease. There is also mild irregularity of the basilar artery. There is a fetal origin of the left PCA. The bilateral PCAs are patent, without high-grade stenosis or occlusion. There is no aneurysm. Anatomic variants: As above. Other: None. IMPRESSION: 1. Focal moderate to severe stenosis of the proximal right superior M2 division and more mild stenosis of the proximal left superior M2 division. 2. Otherwise, mild multifocal irregularity of the intracranial vasculature likely reflecting atherosclerotic disease, without other high-grade stenosis or occlusion. Electronically Signed   By: 05/21/2021 M.D.   On: 05/19/2021 11:43   MR BRAIN WO CONTRAST  Result Date: 05/18/2021 CLINICAL DATA:  Neuro deficit, acute, stroke suspected EXAM: MRI HEAD WITHOUT CONTRAST TECHNIQUE: Multiplanar, multiecho pulse sequences of the brain and surrounding structures were obtained without intravenous contrast. COMPARISON:  None. FINDINGS: Brain: There is a punctate focus of abnormal diffusion restriction at the tail of the left caudate. No acute  or chronic hemorrhage. Hyperintense T2-weighted signal is moderately widespread throughout the white matter. Generalized volume loss without a clear lobar predilection. There are multiple old cerebellar infarcts. The midline structures are normal. Vascular: Major flow voids are preserved. Skull and upper cervical spine: Normal calvarium and skull base. Visualized upper cervical spine and soft tissues are normal. Sinuses/Orbits:No paranasal sinus fluid levels or advanced mucosal thickening. No mastoid or middle ear effusion. Normal orbits. IMPRESSION: 1. Punctate focus of acute ischemia at the left caudate tail. No hemorrhage  or mass effect. 2. 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 Electronically Signed   By: Deatra Robinson M.D.   On: 05/18/2021 21:07   ECHOCARDIOGRAM COMPLETE  Result Date: 05/19/2021    ECHOCARDIOGRAM REPORT   Patient Name:   Ryan Valdez   Date of Exam: 05/19/2021 Medical Rec #:  [id number]  Height:       68.0 in Accession #:    [id number] Weight:       190.0 lb Date of Birth:  [birth year] BSA:          2.000 m Patient Age:    79 years   BP:           147/78 mmHg Patient Gender: M          HR:           44 bpm. Exam Location:  Inpatient Procedure: 2D Echo, Cardiac Doppler and Color Doppler Indications:    Stroke  History:        Patient has no prior history of Echocardiogram examinations.                 Gait instability.  Sonographer:    Mikki Harbor Referring Phys: [id number] MICHAEL C BUTLER IMPRESSIONS  1. Left ventricular ejection fraction, by estimation, is 60 to 65%. The left ventricle has normal function. The left ventricle has no regional wall motion abnormalities. There is mild left ventricular hypertrophy. Left ventricular diastolic parameters were normal.  2. Right ventricular systolic function is normal. The right ventricular size is normal. There is normal pulmonary artery systolic pressure.  3. Left atrial size was moderately dilated.  4.  Cannot r/o PFO consider f/u bubble study.  5. The mitral valve is abnormal. Trivial mitral valve regurgitation. No evidence of mitral stenosis. Moderate mitral annular calcification.  6. The aortic valve is tricuspid. There is moderate calcification of the aortic valve. Aortic valve regurgitation is mild. Sclerosis with no stenosis.  7. The inferior vena cava is normal in size with greater than 50% respiratory variability, suggesting right atrial pressure of 3 mmHg. FINDINGS  Left Ventricle: Left ventricular ejection fraction, by estimation, is 60 to 65%. The left ventricle has normal function. The left ventricle has no regional wall motion abnormalities. The left ventricular internal cavity size was normal in size. There is  mild left ventricular hypertrophy. Left ventricular diastolic parameters were normal. Right Ventricle: The right ventricular size is normal. No increase in right ventricular wall thickness. Right ventricular systolic function is normal. There is normal pulmonary artery systolic pressure. The tricuspid regurgitant velocity is 2.10 m/s, and  with an assumed right atrial pressure of 3 mmHg, the estimated right ventricular systolic pressure is 20.6 mmHg. Left Atrium: Left atrial size was moderately dilated. Right Atrium: Right atrial size was normal in size. Pericardium: There is no evidence of pericardial effusion. Mitral Valve: The mitral valve is abnormal. There is moderate thickening of the mitral valve leaflet(s). There is moderate calcification of the mitral valve leaflet(s). Moderate mitral annular calcification. Trivial mitral valve regurgitation. No evidence of mitral valve stenosis. MV peak gradient, 2.8 mmHg. The mean mitral valve gradient is 1.0 mmHg. Tricuspid Valve: The tricuspid valve is normal in structure. Tricuspid valve regurgitation is trivial. No evidence of tricuspid stenosis. Aortic Valve: The aortic valve is tricuspid. There is moderate calcification of the aortic valve.  Aortic valve regurgitation is mild. Sclerosis with no stenosis. Aortic valve mean gradient measures 6.0 mmHg. Aortic valve peak gradient  measures 13.1 mmHg.  Aortic valve area, by VTI measures 2.17 cm. Pulmonic Valve: The pulmonic valve was normal in structure. Pulmonic valve regurgitation is trivial. No evidence of pulmonic stenosis. Aorta: The aortic root is normal in size and structure. Venous: The inferior vena cava is normal in size with greater than 50% respiratory variability, suggesting right atrial pressure of 3 mmHg. IAS/Shunts: The interatrial septum was not well visualized.  LEFT VENTRICLE PLAX 2D LVIDd:         3.80 cm      Diastology LVIDs:         2.50 cm      LV e' medial:    7.29 cm/s LV PW:         1.20 cm      LV E/e' medial:  10.6 LV IVS:        1.40 cm      LV e' lateral:   10.60 cm/s LVOT diam:     1.90 cm      LV E/e' lateral: 7.3 LV SV:         87 LV SV Index:   43 LVOT Area:     2.84 cm  LV Volumes (MOD) LV vol d, MOD A2C: 105.0 ml LV vol d, MOD A4C: 90.7 ml LV vol s, MOD A2C: 42.1 ml LV vol s, MOD A4C: 30.2 ml LV SV MOD A2C:     62.9 ml LV SV MOD A4C:     90.7 ml LV SV MOD BP:      67.5 ml RIGHT VENTRICLE RV Basal diam:  3.40 cm RV Mid diam:    3.40 cm RV S prime:     11.30 cm/s TAPSE (M-mode): 2.5 cm LEFT ATRIUM             Index        RIGHT ATRIUM           Index LA diam:        4.40 cm 2.20 cm/m   RA Area:     20.40 cm LA Vol (A2C):   73.8 ml 36.91 ml/m  RA Volume:   59.60 ml  29.80 ml/m LA Vol (A4C):   88.0 ml 44.01 ml/m LA Biplane Vol: 87.3 ml 43.66 ml/m  AORTIC VALVE                     PULMONIC VALVE AV Area (Vmax):    2.10 cm      PV Vmax:       0.84 m/s AV Area (Vmean):   2.20 cm      PV Peak grad:  2.8 mmHg AV Area (VTI):     2.17 cm AV Vmax:           181.00 cm/s AV Vmean:          115.000 cm/s AV VTI:            0.400 m AV Peak Grad:      13.1 mmHg AV Mean Grad:      6.0 mmHg LVOT Vmax:         134.00 cm/s LVOT Vmean:        89.100 cm/s LVOT VTI:          0.306 m  LVOT/AV VTI ratio: 0.76  AORTA Ao Root diam: 3.70 cm Ao Asc diam:  3.50 cm MITRAL VALVE               TRICUSPID VALVE MV Area (  PHT): 2.90 cm    TR Peak grad:   17.6 mmHg MV Area VTI:   2.42 cm    TR Vmax:        210.00 cm/s MV Peak grad:  2.8 mmHg MV Mean grad:  1.0 mmHg    SHUNTS MV Vmax:       0.83 m/s    Systemic VTI:  0.31 m MV Vmean:      37.0 cm/s   Systemic Diam: 1.90 cm MV Decel Time: 262 msec MV E velocity: 77.10 cm/s MV A velocity: 70.70 cm/s MV E/A ratio:  1.09 Charlton Haws MD Electronically signed by Charlton Haws MD Signature Date/Time: 05/19/2021/4:02:38 PM    Final    CT HEAD CODE STROKE WO CONTRAST  Result Date: 05/18/2021 CLINICAL DATA:  Code stroke.  Neuro deficit, acute, stroke suspected EXAM: CT HEAD WITHOUT CONTRAST TECHNIQUE: Contiguous axial images were obtained from the base of the skull through the vertex without intravenous contrast. COMPARISON:  2014 FINDINGS: Brain: There is no acute intracranial hemorrhage, mass effect, or edema. Patchy and confluent areas of hypoattenuation involving in the supratentorial white matter and central gray nuclei are nonspecific but probably reflect chronic microvascular ischemic changes. There are likely chronic infarcts of the cerebellum and right centrum semiovale. Prominence of the ventricles and sulci reflects mild parenchymal volume loss. No extra-axial collection. Vascular: No hyperdense vessel. There is intracranial atherosclerotic calcification at the skull base. Skull: Unremarkable. Sinuses/Orbits: Moderate paranasal sinus mucosal thickening primarily involving ethmoids and maxillary sinuses. Orbits are unremarkable. Other: Mastoid air cells are clear. ASPECTS ([place]ta Stroke Program Early CT Score) - Ganglionic level infarction (caudate, lentiform nuclei, internal capsule, insula, M1-M3 cortex): 7 - Supraganglionic infarction (M4-M6 cortex): 3 Total score (0-10 with 10 being normal): 10 IMPRESSION: There is no acute intracranial hemorrhage  or evidence of acute infarction. ASPECT score is 10. Chronic microvascular ischemic changes and likely chronic small vessel infarcts. Presence of a superimposed acute small vessel infarct is difficult to exclude. Initial results were communicated to Dr. Thomasena Edis at 3:13 pm on 05/18/2021 by text page via the [facility] messaging system. Electronically Signed   By: Guadlupe Spanish M.D.   On: 05/18/2021 15:16   VAS US CAROTID (at [facility] and [facility] only)  Result Date: 05/19/2021 Carotid Arterial Duplex Study Patient Name:  Ryan Valdez    Date of Exam:   05/19/2021 Medical Rec #: [id number]   Accession #:    [id number] Date of Birth: Aug 03, 1941  Patient Gender: M Patient Age:   80 years Exam Location:  [facility] Procedure:      VAS US CAROTID Referring Phys: Terrilee Files [facility] --------------------------------------------------------------------------------  Indications:       TIA. Risk Factors:      Hypertension. Comparison Study:  No prior studies. Performing Technologist: Chanda Busing RVT  Examination Guidelines: A complete evaluation includes B-mode imaging, spectral Doppler, color Doppler, and power Doppler as needed of all accessible portions of each vessel. Bilateral testing is considered an integral part of a complete examination. Limited examinations for reoccurring indications may be performed as noted.  Right Carotid Findings: +----------+--------+--------+--------+--------------------------+--------+           PSV cm/sEDV cm/sStenosisPlaque Description        Comments +----------+--------+--------+--------+--------------------------+--------+ CCA Prox  48      7               smooth and heterogenous   tortuous +----------+--------+--------+--------+--------------------------+--------+ CCA Distal71      11  smooth and heterogenous            +----------+--------+--------+--------+--------------------------+--------+ ICA Prox  41      8               irregular and  heterogenous         +----------+--------+--------+--------+--------------------------+--------+ ICA Distal60      12                                        tortuous +----------+--------+--------+--------+--------------------------+--------+ ECA       54      8                                                  +----------+--------+--------+--------+--------------------------+--------+ +----------+--------+-------+--------+-------------------+           PSV cm/sEDV cmsDescribeArm Pressure (mmHG) +----------+--------+-------+--------+-------------------+ [id number]                                         +----------+--------+-------+--------+-------------------+ +---------+--------+--+--------+--+---------+ VertebralPSV cm/s69EDV cm/s13Antegrade +---------+--------+--+--------+--+---------+  Left Carotid Findings: +----------+--------+--------+--------+--------------------------+--------+           PSV cm/sEDV cm/sStenosisPlaque Description        Comments +----------+--------+--------+--------+--------------------------+--------+ CCA Prox  63      15              smooth and heterogenous   tortuous +----------+--------+--------+--------+--------------------------+--------+ CCA Distal49      9               smooth and heterogenous            +----------+--------+--------+--------+--------------------------+--------+ ICA Prox  81      15              irregular and heterogenous         +----------+--------+--------+--------+--------------------------+--------+ ICA Distal59      14                                        tortuous +----------+--------+--------+--------+--------------------------+--------+ ECA       83                                                         +----------+--------+--------+--------+--------------------------+--------+ +----------+--------+--------+--------+-------------------+           PSV cm/sEDV  cm/sDescribeArm Pressure (mmHG) +----------+--------+--------+--------+-------------------+ Subclavian118                                         +----------+--------+--------+--------+-------------------+ +---------+--------+--+--------+--+---------+ VertebralPSV cm/s43EDV cm/s11Antegrade +---------+--------+--+--------+--+---------+   Summary: Right Carotid: Velocities in the right ICA are consistent with a 1-39% stenosis. Left Carotid: Velocities in the left ICA are consistent with a 1-39% stenosis. Vertebrals: Bilateral vertebral arteries demonstrate antegrade flow. *See table(s) above for measurements and observations.  Electronically signed by Sherald Hess MD on 05/19/2021 at 3:36:57 PM.    Final  VAS [place] LOWER EXTREMITY VENOUS (DVT)  Result Date: 05/19/2021  Lower Venous DVT Study Patient Name:  Ryan Valdez    Date of Exam:   05/19/2021 Medical Rec #: [id number]   Accession #:    [id number] Date of Birth: 09/23/1940  Patient Gender: M Patient Age:   36 years Exam Location:  [facility] Procedure:      VAS [place] LOWER EXTREMITY VENOUS (DVT) Referring Phys: Reymundo Poll --------------------------------------------------------------------------------  Indications: Stroke, and Known PFO.  Comparison Study: No prior study Performing Technologist: Sherren Kerns RVS  Examination Guidelines: A complete evaluation includes B-mode imaging, spectral Doppler, color Doppler, and power Doppler as needed of all accessible portions of each vessel. Bilateral testing is considered an integral part of a complete examination. Limited examinations for reoccurring indications may be performed as noted. The reflux portion of the exam is performed with the patient in reverse Trendelenburg.  +---------+---------------+---------+-----------+----------+--------------+ RIGHT    CompressibilityPhasicitySpontaneityPropertiesThrombus Aging  +---------+---------------+---------+-----------+----------+--------------+ CFV      Full           Yes      Yes                                 +---------+---------------+---------+-----------+----------+--------------+ SFJ      Full                                                        +---------+---------------+---------+-----------+----------+--------------+ FV Prox  Full                                                        +---------+---------------+---------+-----------+----------+--------------+ FV Mid   Full                                                        +---------+---------------+---------+-----------+----------+--------------+ FV DistalFull                                                        +---------+---------------+---------+-----------+----------+--------------+ PFV      Full                                                        +---------+---------------+---------+-----------+----------+--------------+ POP      Full           Yes      Yes                                 +---------+---------------+---------+-----------+----------+--------------+ PTV      Full                                                        +---------+---------------+---------+-----------+----------+--------------+  PERO     Full                                                        +---------+---------------+---------+-----------+----------+--------------+   +---------+---------------+---------+-----------+----------+--------------+ LEFT     CompressibilityPhasicitySpontaneityPropertiesThrombus Aging +---------+---------------+---------+-----------+----------+--------------+ CFV      Full           Yes      Yes                                 +---------+---------------+---------+-----------+----------+--------------+ SFJ      Full                                                         +---------+---------------+---------+-----------+----------+--------------+ FV Prox  Full                                                        +---------+---------------+---------+-----------+----------+--------------+ FV Mid   Full                                                        +---------+---------------+---------+-----------+----------+--------------+ FV DistalFull                                                        +---------+---------------+---------+-----------+----------+--------------+ PFV      Full                                                        +---------+---------------+---------+-----------+----------+--------------+ POP      Full           Yes      Yes                                 +---------+---------------+---------+-----------+----------+--------------+ PTV      Full                                                        +---------+---------------+---------+-----------+----------+--------------+ PERO     Full                                                        +---------+---------------+---------+-----------+----------+--------------+  Summary: BILATERAL: - No evidence of deep vein thrombosis seen in the lower extremities, bilaterally. -No evidence of popliteal cyst, bilaterally.   *See table(s) above for measurements and observations.    Preliminary      Discharge Instructions: Discharge Instructions     Diet - low sodium heart healthy   Complete by: As directed    Discharge instructions   Complete by: As directed    [name], Ryan Valdez were admitted into the hospital and found to have a stroke.  It is unclear if this stroke caused the symptoms you were having or if your symptoms are due to something else.  To decrease your risk of future strokes it is important your cholesterol is controlled.  Please take Lipitor daily and follow-up with your primary care provider to see if you need to have an additional medication  to help with your cholesterol.  It is important to control your blood sugar to prevent strokes. We found that you have pre-diabetes.  This means you have elevated blood sugar.  Please try to make food choices that are lower in sugar and carbohydrates to help with this and follow-up with your primary care physician.  Another risk factor for strokes is high blood pressure. Please restart your Amlodipine tomorrow (05/20/2021) and Lisinopril (05/22/2021).  Following a stroke it is important to slowly add blood pressure medications back on.  You will also be discharged on medications that will work to thin your blood to prevent strokes.  These are Aspirin 81 mg and Plavix (Clopidogrel) 75 mg.  You will take Aspirin for three weeks (until 06/09/2021) and then discontinue aspirin.  You will take Plavix for the rest of your life.  [facility] within the next 4 weeks.  Safe travels as you return home and thank you for letting us take part in your care.  Katie Masters, DO   Increase activity slowly   Complete by: As directed        Signed: Verdene Lennert, MD 05/19/2021, 6:51 PM   Pager: [phone]

## 2021-05-19 NOTE — ED Notes (Signed)
Pt resting on stretcher with eyes closed, respirations even and unlabored. Lights off, side rails up x2, call bell within reach. No acute changes noted. Will continue to monitor.

## 2021-05-19 NOTE — Progress Notes (Signed)
Carotid artery duplex has been completed. Preliminary results can be found in CV Proc through chart review.   05/19/21 2:32 PM Olen Cordial RVT

## 2021-05-19 NOTE — Progress Notes (Signed)
VASCULAR LAB    Bilateral lower extremity venous duplex has been performed.  See CV proc for preliminary results.   Perri Aragones, RVT 05/19/2021, 6:35 PM

## 2021-05-19 NOTE — Consult Note (Signed)
NEUROLOGY CONSULTATION NOTE   Date of service: May 19, 2021 Patient Name: Ryan Valdez MRN:  [id number] DOB:  1940/10/27 Reason for consult: "unsteady and poor depth perception" Requesting Provider: Reymundo Poll, MD _ _ _   _ __   _ __ _ _  __ __   _ __   __ _  History of Present Illness  Hulen Mandler is a 80 y.o. male with PMH significant for prior Stroke with residual mild LLE incoordination, HTN, HLD, CAD with stenting who presents with feeling distorted depth perception.  Has had cold and cough for 3-4 days. In the AM yesterday took cold and cough meds, ate breakfast. Drove to help his daughter move around 1130. Had some trouble with depth perception and a couple times got very close to the car ahead of him before he had to brake. No blurred vision, no cross eyes, no double vision, no tunneling of vision, no eye pain or floaters or flashing. Did feel a little foggy in his head and endorses trouble focusing. In the ED, was noted to be somewhat unsteady and code stroke was activated in the ED. His NIHSS was 0.  Reports chronically his L leg and arm are slow after he had a stroke in the back of his brain.  He is back to his baseline now, symptoms may have lasted about an hour. No arm or leg weakness, no numbness, no vision deficit, no facial droop, no dysarthria. No prior hx of similar symptoms, does not smoke, no EtOH use, no hx of diabetes, no hx of hypoglycemic episodes.  Reports that he has chronic bradycardia with heart rate running in 40s-50s. No chest pain, no palpitation.    ROS   Constitutional Denies weight loss, fever and chills.   HEENT Denies changes in vision and hearing.   Respiratory Denies SOB but endorses some cough.   CV Denies palpitations and CP   GI Denies abdominal pain, nausea, vomiting and diarrhea.   GU Denies dysuria and urinary frequency.   MSK Denies myalgia and joint pain.   Skin Denies rash and pruritus.   Neurological Denies headache and syncope.    Psychiatric Denies recent changes in mood. Denies anxiety and depression.    Past History   Past Medical History:  Diagnosis Date  . Bradycardia   . Hypertension   . Stroke [facility]    Past Surgical History:  Procedure Laterality Date  . CAROTID STENT  2007  . NO PAST SURGERIES     Family History  Problem Relation Age of Onset  . Stroke Neg Hx    Social History   Socioeconomic History  . Marital status: Married    Spouse name: Not on file  . Number of children: Not on file  . Years of education: Not on file  . Highest education level: Not on file  Occupational History  . Not on file  Tobacco Use  . Smoking status: Former    Types: Cigarettes    Quit date: 1970    Years since quitting: 52.8  . Smokeless tobacco: Never  Substance and Sexual Activity  . Alcohol use: Yes    Comment: occasional  . Drug use: Never  . Sexual activity: Not Currently  Other Topics Concern  . 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 Iodine Hives  . Iodinated Diagnostic Agents Hives  . Shellfish-Derived Products Hives    Medications  (Not in a hospital admission)    Vitals   Vitals:   05/19/21 0015 05/19/21 0100 05/19/21 0145 05/19/21 0245  BP: (!) 144/53 (!) 126/58 119/69 (!) 151/82  Pulse: (!) 48 (!) 46 (!) 46 (!) 46  Resp: 16 16 16 16   Temp:      TempSrc:      SpO2: 100% 99% 99% 99%  Weight:      Height:         Body mass index is 28.89 kg/m.  Physical Exam   General: Laying comfortably in bed; in no acute distress.  HENT: Normal oropharynx and mucosa. Normal external appearance of ears and nose.  Neck: Supple, no pain or tenderness  CV: No JVD. No peripheral edema.  Pulmonary: Symmetric Chest  rise. Normal respiratory effort.  Abdomen: Soft to touch, non-tender.  Ext: No cyanosis, edema, or deformity  Skin: No rash. Normal palpation of skin.   Musculoskeletal: Normal digits and nails by inspection. No clubbing.   Neurologic Examination  Mental status/Cognition: Alert, oriented to self, place, month and year, good attention. Speech/language: Fluent, comprehension intact, object naming intact, repetition intact. Cranial nerves:   CN II Pupils equal and reactive to light, no VF deficits    CN III,IV,VI EOM intact, no gaze preference or deviation, no nystagmus    CN V normal sensation in V1, V2, and V3 segments bilaterally    CN VII no asymmetry, no nasolabial fold flattening    CN VIII normal hearing to speech   CN IX & X normal palatal elevation, no uvular deviation    CN XI 5/5 head turn and 5/5 shoulder shrug bilaterally    CN XII midline tongue protrusion    Motor:  Muscle bulk: normal, tone normal, pronator drift none tremor none Mvmt Root Nerve  Muscle Right Left Comments  SA C5/6 Ax Deltoid 5 5   EF C5/6 Mc Biceps 5 5   EE C6/7/8 Rad Triceps 5 5   WF C6/7 Med FCR     WE C7/8 PIN ECU     F Ab C8/T1 U ADM/FDI 5 5   HF L1/2/3 Fem Illopsoas 5 5   KE L2/3/4 Fem Quad 5 5   DF L4/5 D Peron Tib Ant 5 5   PF S1/2 Tibial Grc/Sol 5 5    Reflexes:  Right Left Comments  Pectoralis      Biceps (C5/6) 2 2   Brachioradialis (C5/6) 2 2    Triceps (C6/7) 2 2    Patellar (L3/4) 2 2    Achilles (S1)      Hoffman      Plantar     Jaw jerk    Sensation:  Light touch intact   Pin prick    Temperature    Vibration   Proprioception    Coordination/Complex Motor:  - Finger to Nose with LUE ataxia that is mild - Heel to shin with LLE ataxia. - Rapid alternating movement are slowed in LUE and LLE - Gait: Stride length normal. Arm swing normal. Base width narrow.  Labs   CBC:  Recent Labs  Lab 05/18/21 1505 05/18/21 1515  WBC  --  5.5  NEUTROABS  --  3.3  HGB 13.3  12.8*  HCT 39.0 39.7  MCV  --  93.4  PLT  --  210    Basic Metabolic Panel:  Lab Results  Component Value Date   NA 138 05/18/2021   K 3.6 05/18/2021   CO2 24 05/18/2021   GLUCOSE 151 (H) 05/18/2021   BUN 11 05/18/2021   CREATININE 1.34 (H) 05/18/2021   CALCIUM 9.6 05/18/2021   GFRNONAA 55 (L) 05/18/2021   Lipid Panel:  Lab Results  Component Value Date   LDLCALC 109 (H) 05/18/2021   HgbA1c:  Lab Results  Component Value Date   HGBA1C 5.7 (H) 05/18/2021   Urine Drug Screen:     Component Value Date/Time   LABOPIA NONE DETECTED 05/18/2021 1657   COCAINSCRNUR NONE DETECTED 05/18/2021 1657   LABBENZ NONE DETECTED 05/18/2021 1657   AMPHETMU NONE DETECTED 05/18/2021 1657   THCU NONE DETECTED 05/18/2021 1657   LABBARB NONE DETECTED 05/18/2021 1657    Alcohol Level     Component Value Date/Time   ETH <10 05/18/2021 1515    CT Head without contrast: Personally reviewed and CTH was negative for a large hypodensity concerning for a large territory infarct or hyperdensity concerning for an ICH  MR Angio head without contrast and Carotid Duplex BL: pending  MRI Brain: Personally reviewed and notable for incidental punctate left caudate tail stroke   Impression   Spencer Cardinal is a 80 y.o. male with PMH significant for prior Stroke with residual mild LLE incoordination, HTN, HLD, CAD with stenting who presents with feeling distorted depth perception with some trouble focusing. He did take cough and cold meds a couple hours before this. His neurologic examination is notable for mild LUE and LLE incoordination which per patient is long standing after his cerebellar stroke.  I do not have an obivous neurological cause for his presentation, I think the noted left caudate puntate stroke is an incidental small vessel stroke for which we will get stroke workup. This would not explain his episode of distorted depth perception however. Maybe this was related to cold meds which can  sometimes be sedating. Could also be a potential low flow state in the setting of bradycardia as he did endorse difficulty focusing to me during that episode.  Primary Diagnosis:  Other cerebral infarction due to occlusion of stenosis of small artery.  Secondary Diagnosis: Essential (primary) hypertension  Recommendations  Plan:   - Frequent Neuro checks per stroke unit protocol - Recommend Vascular imaging with MRA Angio Head without contrast and US Carotid doppler - Recommend obtaining TTE - Recommend obtaining Lipid panel with LDL - Please start statin if LDL > 70 - Recommend HbA1c - Antithrombotic - aspirin 81mg  daily. - Recommend DVT ppx - SBP goal - permissive hypertension first 24 h < 220/110. Held home meds.  - Recommend Telemetry monitoring for arrythmia - Recommend bedside swallow screen prior to PO intake. - Stroke education booklet - Recommend PT/OT/SLP consult - stroke team to follow along.  ______________________________________________________________________  Plan discussed with Dr. with the Medicine team.  Thank you for the opportunity to take part in the care of this patient. If you have any further questions, please contact the neurology consultation attending.  Signed,  Doran Stabler [facility] Pager Number Erick Blinks _ _ _   _ __   _ __ _ _  __ __   _ __   __ _

## 2021-07-02 ENCOUNTER — Encounter: Payer: Self-pay | Admitting: Adult Health

## 2021-07-02 ENCOUNTER — Inpatient Hospital Stay: Payer: Medicare Other | Admitting: Adult Health

## 2021-07-02 NOTE — Progress Notes (Deleted)
[facility] [address] [place]. [place] [address] [phone]       [place]  Mr. Raymund Breitenbach Date of Birth:  02-12-1941 Medical Record Number:  [id number]   Reason for Referral:  hospital stroke follow up    SUBJECTIVE:   CHIEF COMPLAINT:  No chief complaint on file.   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  CT no acute abnormality, but chronic small left cerebellar infarct.  MRI showed punctate left caudate tail acute infarct.  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  Carotid Doppler unremarkable.  LE venous Doppler no DVT.  EF 60 to 65%.  LDL 109, A1c 5.7, UDS negative.  Etiology for stroke likely small vessel disease.  Recommended DAPT for 3 weeks then Plavix alone and continuation of atorvastatin 80 mg daily.        PERTINENT IMAGING  Per recent hospitalization:  Code Stroke CT head  There is no acute intracranial hemorrhage or evidence of acute infarction. ASPECT score is 10. Chronic microvascular ischemic changes and likely chronic small vessel infarcts. Presence of a superimposed acute small vessel infarct is difficult to exclude.  MRI   1. Punctate focus of acute ischemia at the left caudate tail. No hemorrhage or mass effect. 2. 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  MRA Brain 1. Focal moderate to severe stenosis of the proximal right superior M2 division and more mild stenosis of the proximal left superior M2division. 2. Otherwise, mild multifocal irregularity of the intracranial vasculature likely reflecting atherosclerotic disease, without other high-grade stenosis or occlusion.   Carotid  Doppler  Right Carotid: Velocities in the right ICA are consistent with a 1-39%  stenosis.  Left Carotid: Velocities in the left ICA are consistent with a 1-39%  stenosis.  Vertebrals: Bilateral vertebral arteries demonstrate antegrade flow.   2D Echo   1. Left ventricular ejection fraction, by estimation, is 60 to 65%. The  left ventricle has normal function. The left ventricle has no regional  wall motion abnormalities. There is mild left ventricular hypertrophy.  Left ventricular diastolic parameters  were normal.   2. Right ventricular systolic function is normal. The right ventricular  size is normal. There is normal pulmonary artery systolic pressure.   3. Left atrial size was moderately dilated.   4. Cannot r/o PFO consider f/u bubble study.   5. The mitral valve is abnormal. Trivial mitral valve regurgitation. No  evidence of mitral stenosis. Moderate mitral annular calcification.   6. The aortic valve is tricuspid. There is moderate calcification of the  aortic valve. Aortic valve regurgitation is mild. Sclerosis with no  stenosis.   7. The inferior vena cava is normal in size with greater than 50%  respiratory variability, suggesting right atrial pressure of 3 mmHg.   Bilat LE dopplers: negative    ROS:   14 system review of systems performed and negative with exception of ***  PMH:  Past Medical History:  Diagnosis Date   Bradycardia    Hypertension    Stroke ([place])     PSH:  Past Surgical History:  Procedure Laterality Date   CAROTID STENT  2007   NO PAST SURGERIES  Social History:  Social History   Socioeconomic History   Marital status: Married    Spouse name: Not on file   Number of children: Not on file   Years of education: Not on file   Highest education level: Not on file  Occupational History   Not on file  Tobacco Use   Smoking status: Former    Types: Cigarettes    Quit date: 1970    Years since quitting: 52.9   Smokeless  tobacco: Never  Substance and Sexual Activity   Alcohol use: Yes    Comment: occasional   Drug use: Never   Sexual activity: Not Currently  Other Topics Concern   Not on file  Social History Narrative   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     amLODipine (NORVASC) 10 MG tablet Take 1 tablet (10 mg total) by mouth daily. 30 tablet 0   atorvastatin (LIPITOR) 80 MG tablet Take 80 mg by mouth at bedtime.     benzonatate (TESSALON) 100 MG capsule Take 100 mg by mouth 3 (three) times daily as needed for cough.     cetirizine (ZYRTEC) 10 MG tablet Take 10 mg by mouth daily as needed for allergies.     clopidogrel (PLAVIX) 75 MG tablet Take 1 tablet (75 mg total) by mouth daily. 30 tablet 0   guaiFENesin (MUCINEX) 600 MG 12 hr tablet Take 600 mg by mouth 2 (two) times daily as needed for cough.     lisinopril (ZESTRIL) 40 MG tablet Take 0.5 tablets (20 mg total) by mouth daily. 30 tablet 0   tamsulosin (FLOMAX) 0.4 MG CAPS capsule Take 0.4 mg by mouth daily as needed (when travelling for urinary frequency).     No current facility-administered medications on file prior to visit.    Allergies:   Allergies  Allergen Reactions   Iodine Hives   Iodinated Diagnostic Agents Hives   Shellfish-Derived Products Hives      OBJECTIVE:  Physical Exam  There were no vitals filed for this visit. There is no height or weight on file to calculate BMI. No  results found.  No flowsheet data found.   General: well developed, well nourished, seated, in no evident distress Head: head normocephalic and atraumatic.   Neck: supple with no carotid or supraclavicular bruits Cardiovascular: regular rate and rhythm, no murmurs Musculoskeletal: no deformity Skin:  no rash/petichiae Vascular:  Normal pulses all extremities   Neurologic Exam Mental Status: Awake and fully alert. Oriented to place and time. Recent and remote memory intact. Attention span, concentration and fund of knowledge appropriate. Mood and affect appropriate.  Cranial Nerves: Fundoscopic exam reveals sharp disc margins. Pupils equal, briskly reactive to light. Extraocular movements full without nystagmus. Visual fields full to confrontation. Hearing intact. Facial sensation intact. Face, tongue, palate moves normally and symmetrically.  Motor: Normal bulk and tone. Normal strength in  all tested extremity muscles Sensory.: intact to touch , pinprick , position and vibratory sensation.  Coordination: Rapid alternating movements normal in all extremities. Finger-to-nose and heel-to-shin performed accurately bilaterally. Gait and Station: Arises from chair without difficulty. Stance is normal. Gait demonstrates normal stride length and balance with ***. Tandem walk and heel toe ***.  Reflexes: 1+ and symmetric. Toes downgoing.     NIHSS  *** Modified Rankin  ***      ASSESSMENT: Ryan Valdez is a 80 y.o. year old male ***. Vascular risk factors include ***.      PLAN:  *** : Residual deficit: ***. Continue {anticoagulants:31417}  and ***  for secondary stroke prevention.  Discussed secondary stroke prevention measures and importance of close PCP follow up for aggressive stroke risk factor management. I have gone over the pathophysiology of stroke, warning signs and symptoms, risk factors and their management in some detail with instructions to go to the closest emergency room for  symptoms of concern. HTN: BP goal <130/90.  Stable on *** per PCP HLD: LDL goal <70. Recent LDL ***.  DMII: A1c goal<7.0. Recent A1c ***.     Follow up in *** or call earlier if needed   CC:  [facility] provider: Dr. Leonie Man PCP: [facility]    I spent *** minutes of face-to-face and non-face-to-face time with patient.  This included previsit chart review including review of recent hospitalization, lab review, study review, order entry, electronic health record documentation, patient education regarding recent stroke including etiology, secondary stroke prevention measures and importance of managing stroke risk factors, residual deficits and typical recovery time and answered all other questions to patient satisfaction   Frann Rider, AGNP-BC  [facility] [address] [place], [place] [address]  Phone [phone] Fax [phone] Note: This document was prepared with digital dictation and possible smart phrase technology. Any transcriptional errors that result from this process are unintentional.

## 2022-09-16 IMAGING — CT CT HEAD CODE STROKE
3 series · 15 of 47 positions shown, 18 images · non-contrast
Comparison: 6080

CLINICAL DATA: Code stroke.  Neuro deficit, acute, stroke suspected

EXAM:
CT HEAD WITHOUT CONTRAST
TECHNIQUE: Contiguous axial images were obtained from the base of the skull
through the vertex without intravenous contrast.

[Series 3: head 5.0 st · axial · 0.42mm/px · z∈[+1324,+1464]mm · 9 of 34 slices shown, 12 images]
[im 3/34  brain]
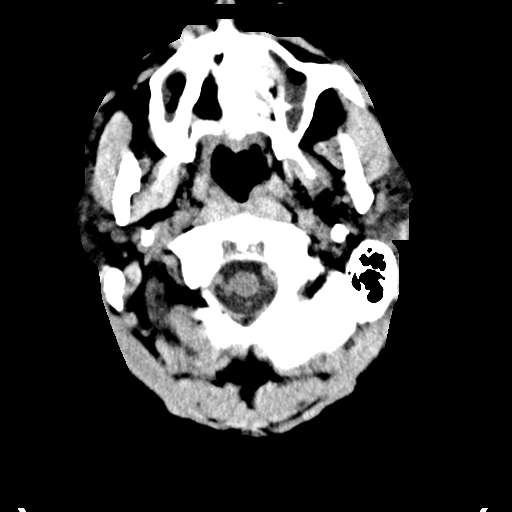
[im 3/34  bone]
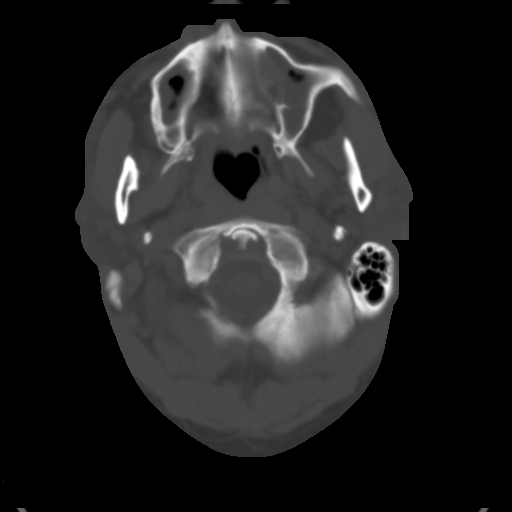
[im 6/34  brain]
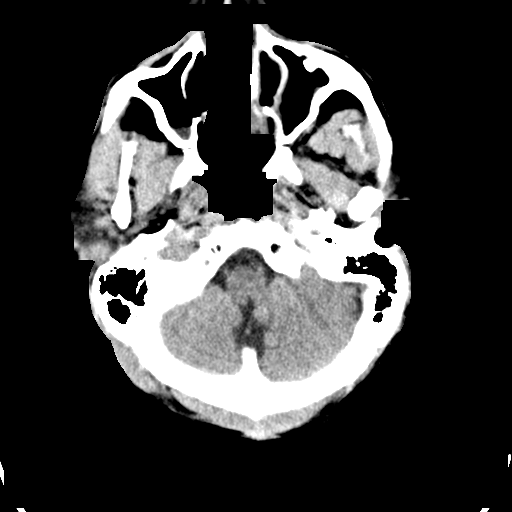
[im 10/34  brain]
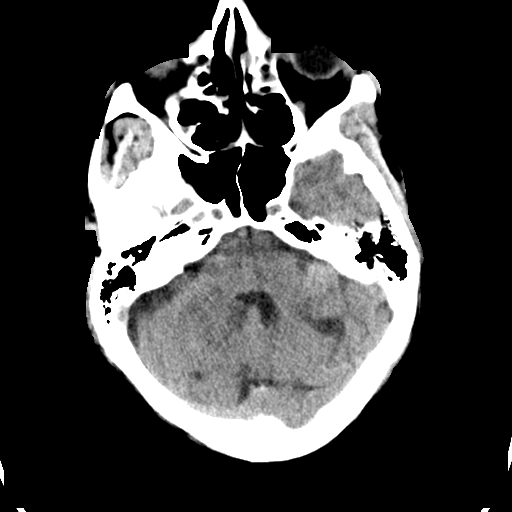
[im 13/34  brain]
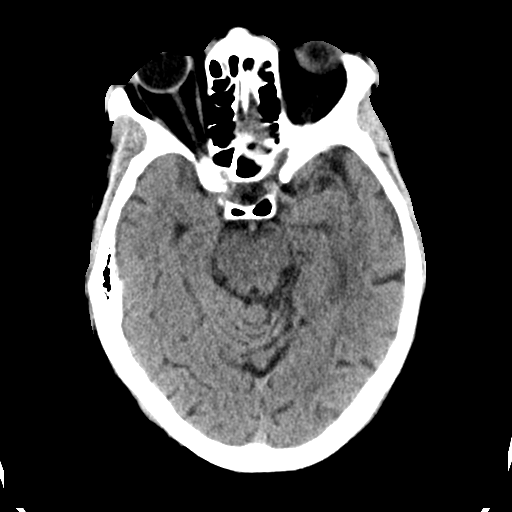
[im 18/34  brain]
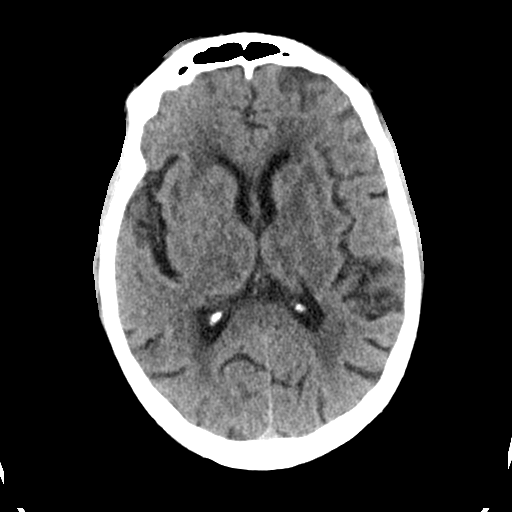
[im 18/34  bone]
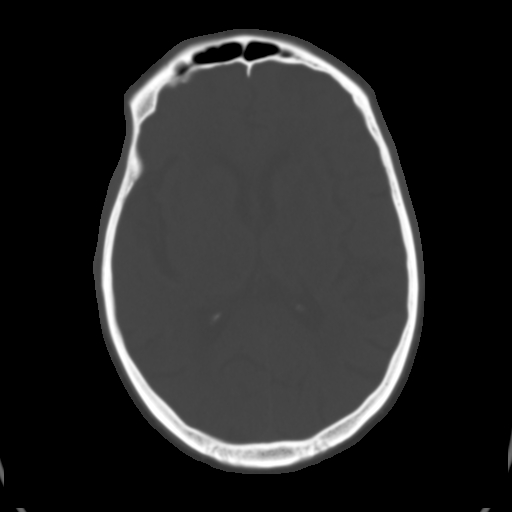
[im 21/34  brain]
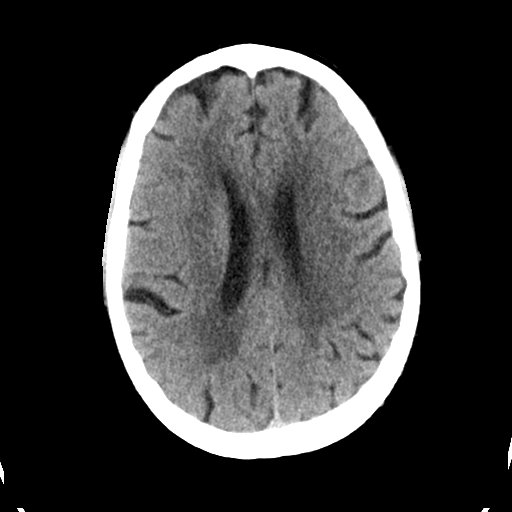
[im 24/34  brain]
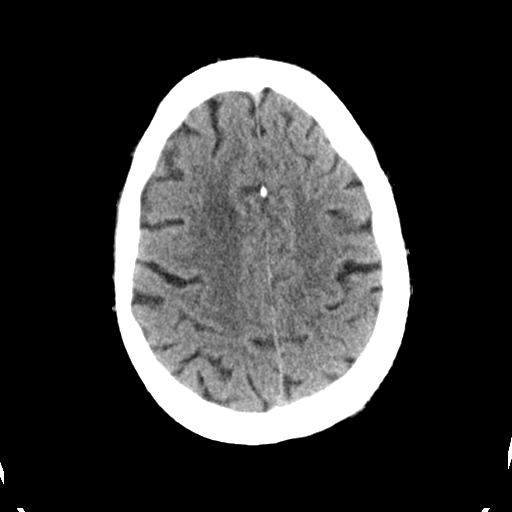
[im 28/34  brain]
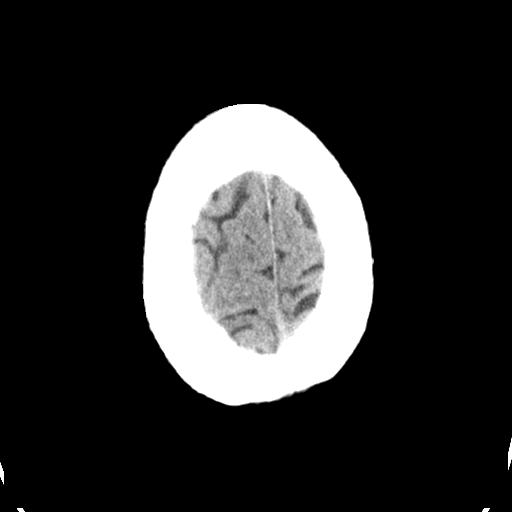
[im 31/34  brain]
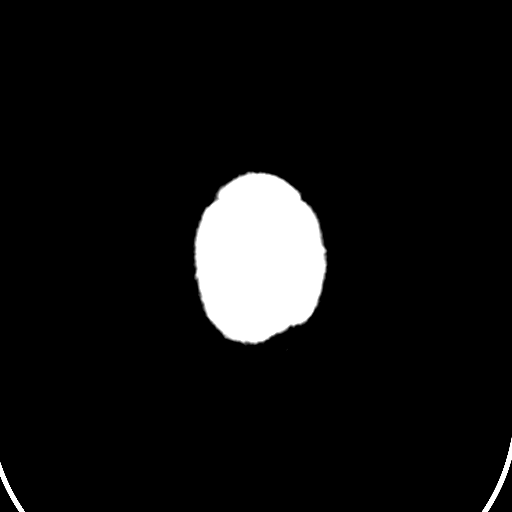
[im 31/34  bone]
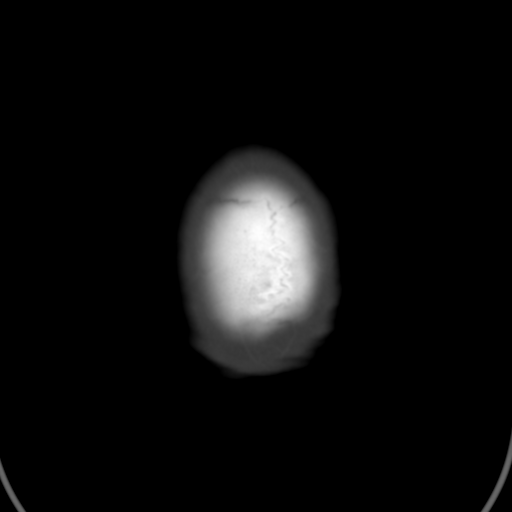

[Series 5: head 3.0 cor st · coronal · 0.32mm/px · 3 of 69 slices shown]
[im 23/69  brain]
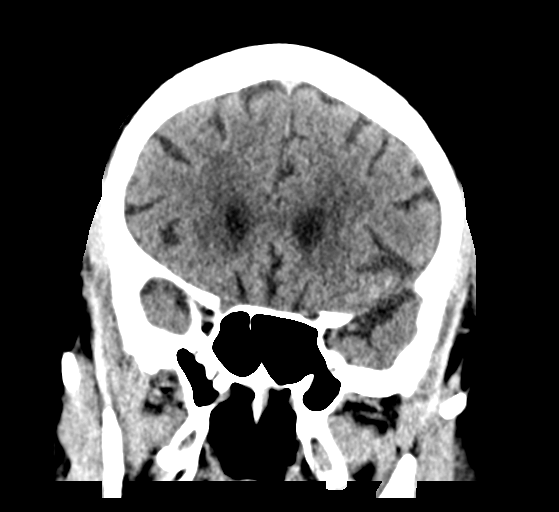
[im 31/69  brain]
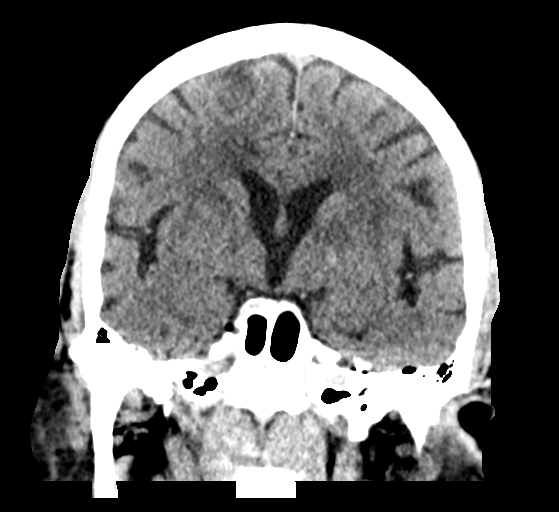
[im 38/69  brain]
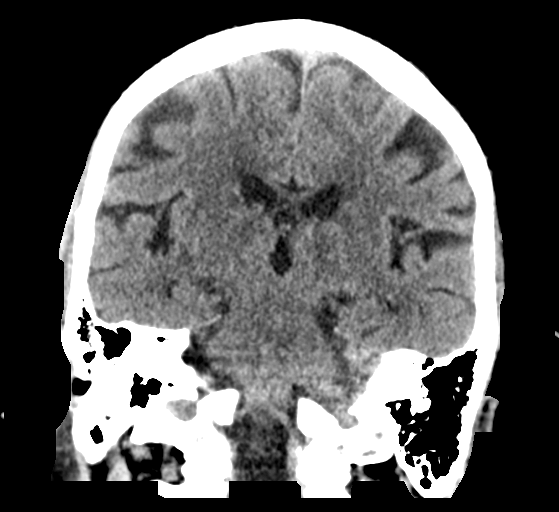

[Series 6: head 3.0 sag st · sagittal · 0.32mm/px · 3 of 61 slices shown]
[im 21/61  brain]
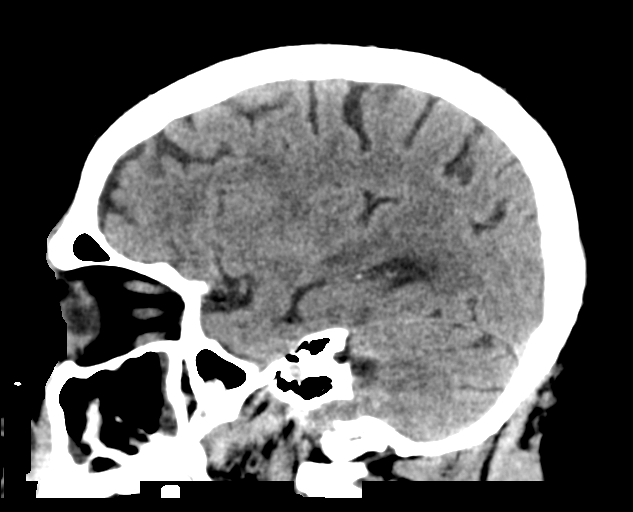
[im 31/61  brain]
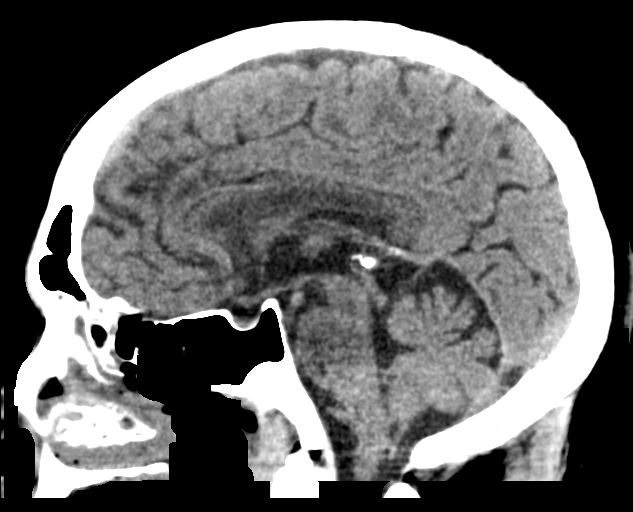
[im 41/61  brain]
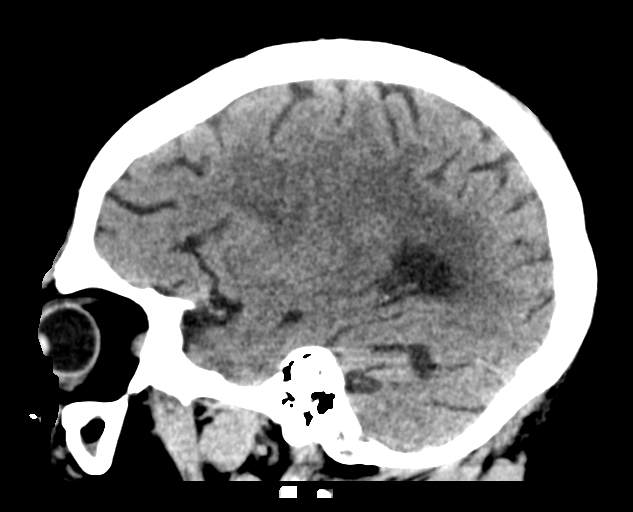

[15 of 47 positions shown; findings below may reference images not displayed]

FINDINGS: Brain: There is no acute intracranial hemorrhage, mass effect, or
edema. Patchy and confluent areas of hypoattenuation involving in
the supratentorial white matter and central gray nuclei are
nonspecific but probably reflect chronic microvascular ischemic
changes. There are likely chronic infarcts of the cerebellum and
right centrum semiovale. Prominence of the ventricles and sulci
reflects mild parenchymal volume loss. No extra-axial collection.

Vascular: No hyperdense vessel. There is intracranial
atherosclerotic calcification at the skull base.

Skull: Unremarkable.

Sinuses/Orbits: Moderate paranasal sinus mucosal thickening
primarily involving ethmoids and maxillary sinuses. Orbits are
unremarkable.

Other: Mastoid air cells are clear.

ASPECTS ([place] Stroke Program Early CT Score)

- Ganglionic level infarction (caudate, lentiform nuclei, internal
capsule, insula, M1-M3 cortex): 7

- Supraganglionic infarction (M4-M6 cortex): 3

Total score (0-10 with 10 being normal): 10
IMPRESSION: There is no acute intracranial hemorrhage or evidence of acute
infarction. ASPECT score is 10.

Chronic microvascular ischemic changes and likely chronic small
vessel infarcts. Presence of a superimposed acute small vessel
infarct is difficult to exclude.

Initial results were communicated to Dr. Fat at [DATE] on
05/18/2021 by text page via the [employer] messaging system.
